# Patient Record
Sex: Female | Born: 1945 | Hispanic: No | Smoking: Never smoker
Health system: Southern US, Community
[De-identification: ages and names within clinical notes are randomized; demographics above are authoritative.]

## PROBLEM LIST (undated history)

## (undated) DIAGNOSIS — H269 Unspecified cataract: Secondary | ICD-10-CM

## (undated) DIAGNOSIS — I10 Essential (primary) hypertension: Secondary | ICD-10-CM

## (undated) DIAGNOSIS — A150 Tuberculosis of lung: Secondary | ICD-10-CM

## (undated) DIAGNOSIS — E119 Type 2 diabetes mellitus without complications: Secondary | ICD-10-CM

## (undated) DIAGNOSIS — E0590 Thyrotoxicosis, unspecified without thyrotoxic crisis or storm: Secondary | ICD-10-CM

## (undated) DIAGNOSIS — K219 Gastro-esophageal reflux disease without esophagitis: Secondary | ICD-10-CM

## (undated) HISTORY — DX: Unspecified cataract: H26.9

## (undated) HISTORY — DX: Tuberculosis of lung: A15.0

## (undated) HISTORY — DX: Gastro-esophageal reflux disease without esophagitis: K21.9

## (undated) HISTORY — PX: TOTAL ABDOMINAL HYSTERECTOMY: SHX209

## (undated) HISTORY — PX: EYE SURGERY: SHX253

## (undated) HISTORY — DX: Essential (primary) hypertension: I10

## (undated) HISTORY — DX: Thyrotoxicosis, unspecified without thyrotoxic crisis or storm: E05.90

## (undated) HISTORY — DX: Type 2 diabetes mellitus without complications: E11.9

---

## 1977-04-24 HISTORY — PX: THYROIDECTOMY: SHX17

## 1978-04-24 DIAGNOSIS — A150 Tuberculosis of lung: Secondary | ICD-10-CM

## 1978-04-24 HISTORY — DX: Tuberculosis of lung: A15.0

## 2009-01-18 ENCOUNTER — Encounter: Admission: RE | Admit: 2009-01-18 | Discharge: 2009-01-18 | Payer: Self-pay | Admitting: Specialist

## 2009-01-18 IMAGING — CR DG CHEST 1V
1 series · 1 of 1 positions shown · non-contrast
Comparison: None

CLINICAL DATA: Positive PPD.

CHEST - 1 VIEW

[view not recorded]
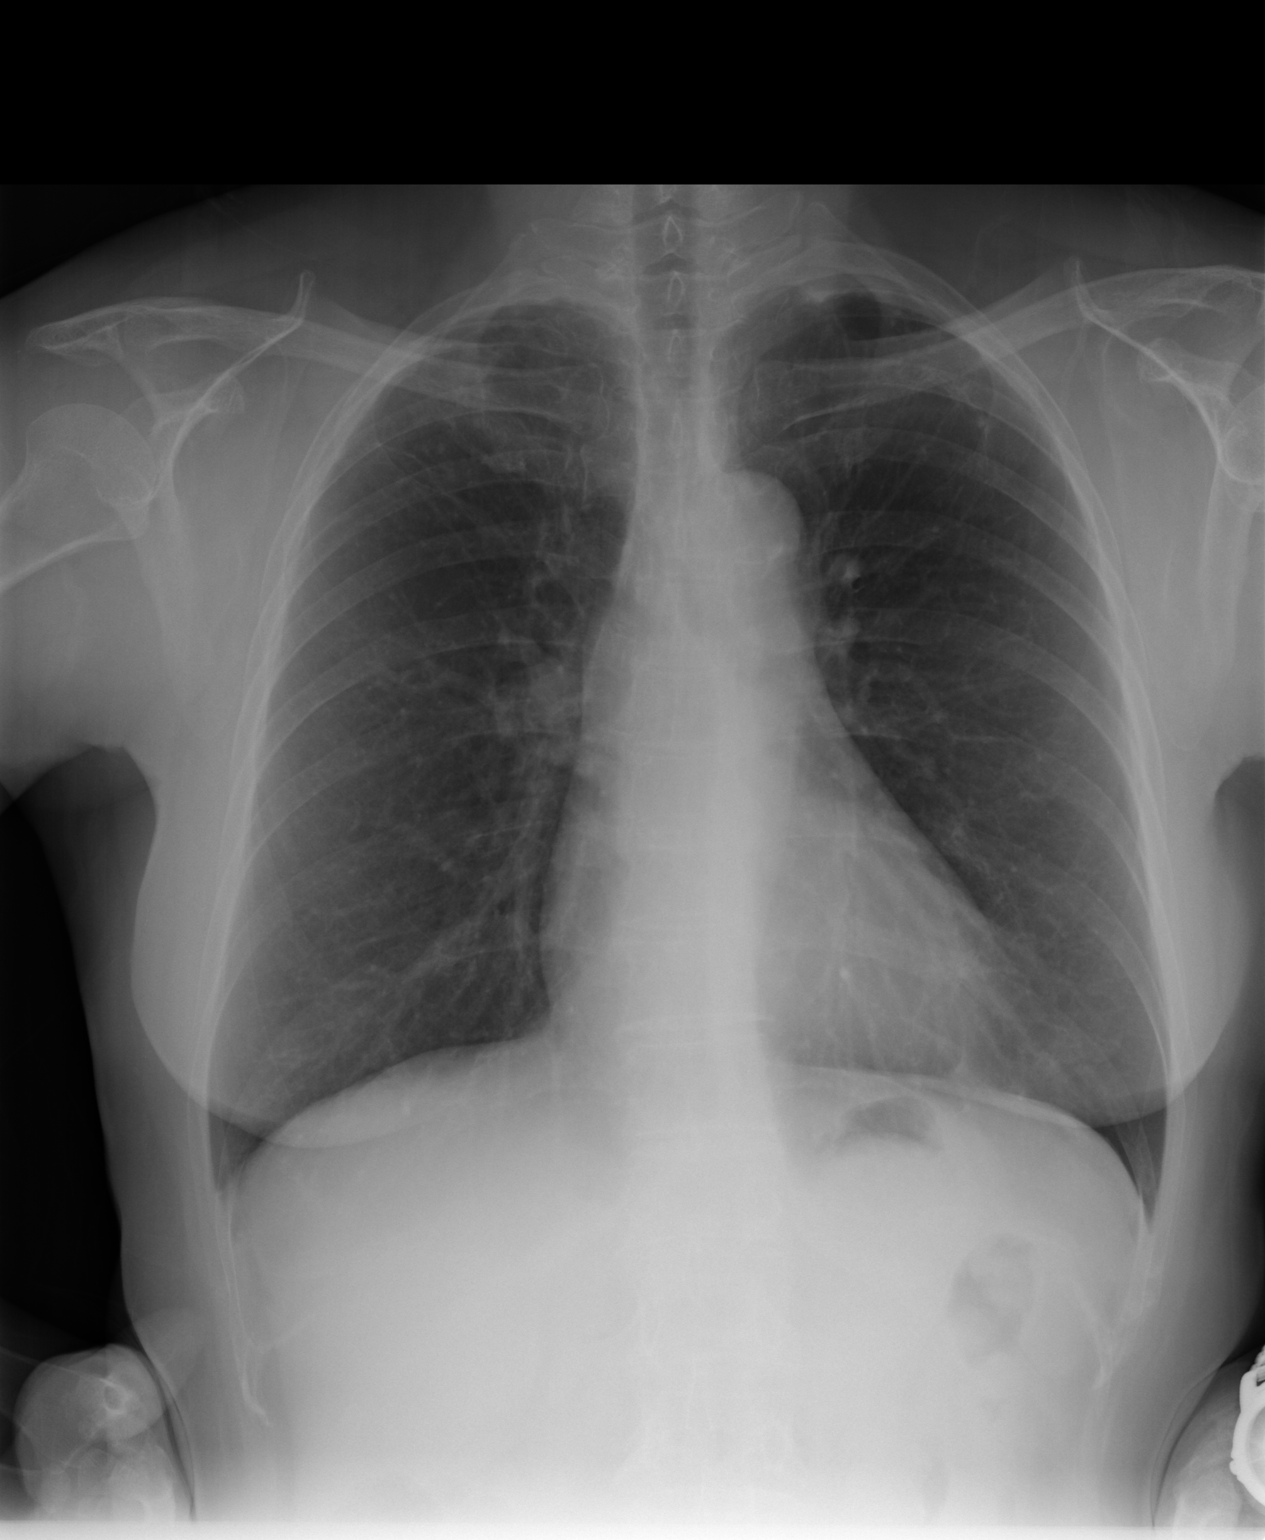

[1 of 1 positions shown; findings below may reference images not displayed]

FINDINGS: There are densities noted in the apices bilaterally,
right greater than left.  Cannot exclude reactivation TB.
Remainder lung fields are clear.  No effusions.  No acute bony
abnormality.
IMPRESSION: Predominately linear opacities in the apices bilaterally, right
greater than left.  Cannot exclude reactivation TB.

## 2011-08-22 ENCOUNTER — Ambulatory Visit (INDEPENDENT_AMBULATORY_CARE_PROVIDER_SITE_OTHER): Payer: Self-pay | Admitting: Sports Medicine

## 2011-08-22 ENCOUNTER — Encounter: Payer: Self-pay | Admitting: Sports Medicine

## 2011-08-22 VITALS — BP 162/94 | HR 82 | Temp 97.5°F | Ht 60.5 in | Wt 136.4 lb

## 2011-08-22 DIAGNOSIS — Z79899 Other long term (current) drug therapy: Secondary | ICD-10-CM | POA: Insufficient documentation

## 2011-08-22 DIAGNOSIS — Z0000 Encounter for general adult medical examination without abnormal findings: Secondary | ICD-10-CM

## 2011-08-22 DIAGNOSIS — Z8639 Personal history of other endocrine, nutritional and metabolic disease: Secondary | ICD-10-CM

## 2011-08-22 DIAGNOSIS — R030 Elevated blood-pressure reading, without diagnosis of hypertension: Secondary | ICD-10-CM

## 2011-08-22 DIAGNOSIS — I10 Essential (primary) hypertension: Secondary | ICD-10-CM | POA: Insufficient documentation

## 2011-08-22 NOTE — Assessment & Plan Note (Signed)
Will check TSH, BMET and fasting lipid panel.   Pt with

## 2011-08-22 NOTE — Patient Instructions (Signed)
It was nice to see you today.      Today we discussed your health maintenance and your elevated BP. You are due for blood work as well as a screening tests for colon cancer.   Your BP was elevated today to 162/94.  Our goal would be to get your BP to 120/80.  We will need to recheck your BP at your next visit to confirm this diagnosis but I anticipate that we will need to start medicines at your next visit.  You are due to screening for colon cancer.  I would encourage you to complete the Hemocult cards that we provided you today and bring these back with you when you come for your blood work (or you can mail them in).     You are due for Blood Work to check your thyroid, sugar and cholesterol.  After you receive [facility] Card) please call our office to come in for a LAB/Nurses visit to have your blood drawn and to have a BP reading.   Please plan to return to see me in ~1 week after your have your blood work drawn so we may discuss your BP and the results of your blood work.  If you need anything prior to seeing me please call the clinic.

## 2011-08-22 NOTE — Progress Notes (Signed)
HPI:  Kimberly House is a 66 y.o. female presenting today to establish care.  Reports otherwise healthy.  Does note that she has seen elevated BP on friends BP CUFF but no formal documentation.  Was told ~3-4 yrs ago blood glucose levels boarderline elevated but no intervention.  Has not been seeing Dr. Since in the [place].  See prior history section.  States overall no problems.   ROS Review of Systems  Constitutional: Negative for fever, chills and malaise/fatigue.  HENT: Negative for congestion.   Cardiovascular: Negative for chest pain and palpitations.       No exertional chest pain; reports 1 episode of chest soreness following shoveling snow but no pain experienced during shoveling  Gastrointestinal: Negative for blood in stool and melena.  Genitourinary: Negative for flank pain.  Musculoskeletal: Negative for back pain and falls.  Neurological: Negative for focal weakness, loss of consciousness and weakness.  Endo/Heme/Allergies: Negative for polydipsia.     HISTORY Medications Reviewed & Updated,: No meds Medical Hx Reviewed: Significant for prior hysterectomy, thyroidectomy and pulmonary TB Family History Reviewed: Yes  and fairly unremarkable Social History Reviewed:  Please see section PE: GENERAL:  Adult filipina female.  Examined in [facility].   In no discomfort; norespiratory distress.   PSYCH: Alert and appropriately interactive; Insight:Good  Alert, oriented, thought content appropriate H&N:  NECK: supple, no adenopathy, thyroid normal in size, no nodules or tenderness and trachea midline large  Low cervical transverse scar EYES: conjunctivae/corneas clear. PERRL, EOM's intact. Fundi benign. ENT+Mouth: normal TM's and external ear canals both earsnormalnormal findings: lips normal without lesions and buccal mucosa normal and abnormal findings: dentition: multiple carries THORAX: HEART: RRR, S1/S2 heard, no murmur LUNGS: CTA B, no wheezes, no crackles ABDOMEN:  +BS,  soft, non-tender, no rigidity, no guarding, no masses/organomegaly EXTREMITIES: Moves all 4 extremities spontaneously, warm well perfused, no edema, bilateral DP and PT pulses 2/4.   >NEURO: CN II-XII intact; no focal deficits in UE myotomes.

## 2011-08-22 NOTE — Assessment & Plan Note (Signed)
Will check TSH today but no s/sx of thyroid disease

## 2011-09-05 LAB — POC HEMOCCULT BLD/STL (HOME/3-CARD/SCREEN): Card #3 Fecal Occult Blood, POC: NEGATIVE

## 2011-09-05 NOTE — Progress Notes (Signed)
Addended by: [place], Giordano Getman on: 09/05/2011 05:48 PM   Modules accepted: Orders

## 2011-09-11 ENCOUNTER — Other Ambulatory Visit: Payer: Self-pay | Admitting: Sports Medicine

## 2011-10-05 ENCOUNTER — Other Ambulatory Visit: Payer: Self-pay

## 2011-10-05 DIAGNOSIS — Z0000 Encounter for general adult medical examination without abnormal findings: Secondary | ICD-10-CM

## 2011-10-05 LAB — BASIC METABOLIC PANEL
CO2: 26 mEq/L (ref 19–32)
Chloride: 106 mEq/L (ref 96–112)

## 2011-10-05 LAB — LIPID PANEL
Cholesterol: 208 mg/dL — ABNORMAL HIGH (ref 0–200)
HDL: 59 mg/dL (ref >39)
Total CHOL/HDL Ratio: 3.5 Ratio
Triglycerides: 68 mg/dL (ref <150)
VLDL: 14 mg/dL (ref 0–40)

## 2011-10-05 NOTE — Progress Notes (Signed)
BMP,FLP AND TSH DONE TODAY Kimberly House 

## 2011-11-14 ENCOUNTER — Telehealth: Payer: Self-pay | Admitting: *Deleted

## 2011-11-14 NOTE — Telephone Encounter (Signed)
Message copied by Deno Etienne on Tue Nov 14, 2011  8:56 AM ------      Message from: Gaspar Bidding D      Created: Mon Nov 13, 2011 10:24 PM       Please call pt and have her make an appointment to follow up her labs.  She has been lost to follow up

## 2011-11-14 NOTE — Telephone Encounter (Signed)
Called pt and she will come in to discuss results on 8.5.2013 @ 930 am with Dr. Berline Chough.Loralee Pacas [place]

## 2011-11-14 NOTE — Telephone Encounter (Signed)
Called and lvm for pt to call to schedule a follow up appt.Loralee Pacas [place]

## 2011-11-27 ENCOUNTER — Ambulatory Visit: Payer: Self-pay | Admitting: Sports Medicine

## 2011-11-30 ENCOUNTER — Encounter: Payer: Self-pay | Admitting: Sports Medicine

## 2011-11-30 ENCOUNTER — Ambulatory Visit (INDEPENDENT_AMBULATORY_CARE_PROVIDER_SITE_OTHER): Payer: Self-pay | Admitting: Sports Medicine

## 2011-11-30 VITALS — BP 158/88 | HR 90 | Temp 98.5°F | Ht 60.5 in | Wt 136.0 lb

## 2011-11-30 DIAGNOSIS — Z8639 Personal history of other endocrine, nutritional and metabolic disease: Secondary | ICD-10-CM

## 2011-11-30 DIAGNOSIS — R030 Elevated blood-pressure reading, without diagnosis of hypertension: Secondary | ICD-10-CM

## 2011-11-30 DIAGNOSIS — Z0000 Encounter for general adult medical examination without abnormal findings: Secondary | ICD-10-CM

## 2011-11-30 DIAGNOSIS — IMO0001 Reserved for inherently not codable concepts without codable children: Secondary | ICD-10-CM

## 2011-11-30 DIAGNOSIS — Z862 Personal history of diseases of the blood and blood-forming organs and certain disorders involving the immune mechanism: Secondary | ICD-10-CM

## 2011-11-30 DIAGNOSIS — E119 Type 2 diabetes mellitus without complications: Secondary | ICD-10-CM

## 2011-11-30 MED ORDER — METFORMIN HCL 500 MG PO TABS: 500.0000 mg | ORAL_TABLET | Freq: Two times a day (BID) | ORAL | Status: DC

## 2011-11-30 MED ORDER — LISINOPRIL-HYDROCHLOROTHIAZIDE 20-12.5 MG PO TABS: 1.0000 | ORAL_TABLET | Freq: Every day | ORAL | Status: DC

## 2011-11-30 NOTE — Patient Instructions (Addendum)
It was nice to see you today.  Today we discussed:   Your blood pressure.  I started you on a medication that I want you to take once a day.  Your diabetes.  I started you on a medication to take twice a day  The best treatment is to try to make lifestyle changes. Here are some basic nutrition rules to remember:  "Eat Real Foods & Drink Real Drinks" - if you think it was made in a factory . . it is likely best to avoid it as a staple in your diet.  Limiting these types of foods to 1-2 times per week is a good idea.  Sticking with fresh fruits and vegetables as well as home cooked meals will typically provide more nutrition and less salt than prepackaged meals.     Limit the amount of sugar sweetened and artificially sweetened foods and beverages.  Sticking with water flavored with a slice of lemon, lime or orange is a great option if you want something with flavor in it.  Using flavored seltzer water to flavor plain water will also add some bite if you want something more than flavor.     Here are 2 of my favorite web sites that provide great nutrition and exercise advice.   [email or web address] [email or web address].be  Please plan to return to see me in 3 months - .  If you need anything prior to seeing me please call the clinic.  Please call in October to make an appointment in November  Please return in 1 week for blood work.   Here are your lab results from last visit.  Your sugars are high and your cholesterol is high.  Your thyroid is borderline low.     Basename 10/05/11 0838  NA 141  K 4.4  CL 106  CO2 26  BUN 17  CREATININE 0.69  CALCIUM 9.4  GLUCOSE 134*     Basename 10/05/11 0838  CHOL 208*  TRIG 68  HDL 59    Basename 10/05/11 0838  TSH 4.391  T3FREE --  FREET4 --  T3TOTAL --  T4TOTAL --  THYROIDAB --

## 2011-11-30 NOTE — Assessment & Plan Note (Addendum)
Will start lifestyle modifications.   Given Nutrition handout in AVS Rx for Metformin 500 bid A1c at lab visit for baseline

## 2011-11-30 NOTE — Assessment & Plan Note (Addendum)
Elevated again today.  Will start dual therapy with ACEi/HCTZ given SBP > 160 on presentation. Lifestyle changes per AVS Will recheck Cr in 1 week after starting therapy.

## 2011-11-30 NOTE — Assessment & Plan Note (Signed)
TSH borderline elevated.  Will obtain T3 and T4 on follow up lab draw in 1 week. Pt does not wish for starting Synthroid unless absolutely required

## 2011-12-05 NOTE — Progress Notes (Signed)
  [facility]  Patient name: Kimberly House MRN [id number]  Date of birth: 06/16/1945  CC & HPI:  Kimberly House is a 66 y.o. female presenting today for follow-up of abnormal lab values.  Problems:   Hypertension - chronic problem, marginally controlled.   Pt reports no chest pain, no dyspnea on exertion, no orthopnea, no peripheral edema, no TIAs,   Diabetes - chronic problem, marginally controlled.  Patient reports no polyuria or polydipsia, no chest pain, dyspnea or TIA's, no numbness, tingling or pain in extremities  Hyperlipidemia - chronic problem, marginally controlled  Basename 10/05/11 0838  CHOL 208*  TRIG 68  HDL 59    Medication Compliance: has not been on any  Diet Compliance: noncompliant much of the time New Concerns: none  Thyroid ROS: denies fatigue, weight changes, heat/cold intolerance, bowel/skin changes or CVS symptoms.  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  Examined in [facility].  In no discomfort; no respiratory distress. PSYCH: Alert and appropriately interactive; Insight:Good   H&N: AT/, MMM, no scleral icterus, EOMi THORAX: HEART: RRR, S1/S2 heard, no murmur LUNGS: CTA B, no wheezes, no crackles EXTREMITIES: Moves all 4 extremities spontaneously, warm well perfused, no edema, bilateral DP and PT pulses 2/4.     Assessment & Plan:

## 2011-12-07 ENCOUNTER — Other Ambulatory Visit: Payer: Self-pay

## 2011-12-08 ENCOUNTER — Other Ambulatory Visit (INDEPENDENT_AMBULATORY_CARE_PROVIDER_SITE_OTHER): Payer: Self-pay

## 2011-12-08 DIAGNOSIS — IMO0001 Reserved for inherently not codable concepts without codable children: Secondary | ICD-10-CM

## 2011-12-08 DIAGNOSIS — E119 Type 2 diabetes mellitus without complications: Secondary | ICD-10-CM

## 2011-12-08 DIAGNOSIS — Z8639 Personal history of other endocrine, nutritional and metabolic disease: Secondary | ICD-10-CM

## 2011-12-08 NOTE — Progress Notes (Signed)
BMP,F T4,F T3,AND A1C DONE TODAY Kimberly House

## 2011-12-09 LAB — BASIC METABOLIC PANEL
CO2: 27 mEq/L (ref 19–32)
Chloride: 101 mEq/L (ref 96–112)
Glucose, Bld: 168 mg/dL — ABNORMAL HIGH (ref 70–99)
Potassium: 4.4 mEq/L (ref 3.5–5.3)
Sodium: 139 mEq/L (ref 135–145)

## 2011-12-09 LAB — T4, FREE: Free T4: 1.1 ng/dL (ref 0.80–1.80)

## 2013-01-09 ENCOUNTER — Ambulatory Visit (INDEPENDENT_AMBULATORY_CARE_PROVIDER_SITE_OTHER): Payer: Self-pay | Admitting: Sports Medicine

## 2013-01-09 VITALS — BP 129/76 | HR 93 | Temp 99.0°F | Ht 65.0 in | Wt 132.0 lb

## 2013-01-09 DIAGNOSIS — Z8639 Personal history of other endocrine, nutritional and metabolic disease: Secondary | ICD-10-CM

## 2013-01-09 DIAGNOSIS — IMO0001 Reserved for inherently not codable concepts without codable children: Secondary | ICD-10-CM

## 2013-01-09 DIAGNOSIS — E785 Hyperlipidemia, unspecified: Secondary | ICD-10-CM

## 2013-01-09 DIAGNOSIS — Z862 Personal history of diseases of the blood and blood-forming organs and certain disorders involving the immune mechanism: Secondary | ICD-10-CM

## 2013-01-09 DIAGNOSIS — Z0000 Encounter for general adult medical examination without abnormal findings: Secondary | ICD-10-CM

## 2013-01-09 DIAGNOSIS — R030 Elevated blood-pressure reading, without diagnosis of hypertension: Secondary | ICD-10-CM

## 2013-01-09 DIAGNOSIS — E119 Type 2 diabetes mellitus without complications: Secondary | ICD-10-CM

## 2013-01-09 LAB — POCT GLYCOSYLATED HEMOGLOBIN (HGB A1C): Hemoglobin A1C: 6.7

## 2013-01-09 LAB — COMPREHENSIVE METABOLIC PANEL WITH GFR
ALT: 31 U/L (ref 0–35)
AST: 22 U/L (ref 0–37)
Albumin: 4.4 g/dL (ref 3.5–5.2)
Alkaline Phosphatase: 45 U/L (ref 39–117)
BUN: 16 mg/dL (ref 6–23)
CO2: 25 meq/L (ref 19–32)
Calcium: 10 mg/dL (ref 8.4–10.5)
Chloride: 103 meq/L (ref 96–112)
Creat: 0.82 mg/dL (ref 0.50–1.10)
Glucose, Bld: 122 mg/dL — ABNORMAL HIGH (ref 70–99)
Potassium: 4.4 meq/L (ref 3.5–5.3)
Sodium: 139 meq/L (ref 135–145)
Total Bilirubin: 0.4 mg/dL (ref 0.3–1.2)
Total Protein: 7.9 g/dL (ref 6.0–8.3)

## 2013-01-09 LAB — LIPID PANEL
Cholesterol: 206 mg/dL — ABNORMAL HIGH (ref 0–200)
HDL: 56 mg/dL
LDL Cholesterol: 126 mg/dL — ABNORMAL HIGH (ref 0–99)
Total CHOL/HDL Ratio: 3.7 ratio
Triglycerides: 122 mg/dL
VLDL: 24 mg/dL (ref 0–40)

## 2013-01-09 NOTE — Assessment & Plan Note (Signed)
Improved today.  Continue current medications.  Refills provided

## 2013-01-09 NOTE — Patient Instructions (Signed)
It was nice to see you today, thanks for coming in!  Health maintenance examination We are checking labs today  Elevated BP Keep taking your medications for now and exercising daily  Diabetes mellitus I will call you if we are going to start a new medicine to protect your blood vessels   Please plan to return to see me in 3 months.    If you need anything prior to your next visit please call the clinic. Please Bring all medications or accurate medication list with you to each appointment; an accurate medication list is essential in providing you the best care possible.

## 2013-01-09 NOTE — Assessment & Plan Note (Signed)
Stable, Recheck TSH today

## 2013-01-09 NOTE — Assessment & Plan Note (Addendum)
No insurance and delcines colonoscopy, Given information for mammogram Declines immunizations due to cost - [facility] shot [facility]  Recheck lipid profile.  Recalculate ASCVD score using controlled BP given held meds today for fasting labs and home values in 100s consistently.  If elevated start statin

## 2013-01-09 NOTE — Assessment & Plan Note (Signed)
Check A1c today. Call if meds changed

## 2013-01-09 NOTE — Progress Notes (Signed)
  [facility]  Alizeh Madril Riggston - 67 y.o. female MRN [id number]  Date of birth: 23-Aug-1945  HPI & ROS  Kimberly House presents today for yearly exam.  She was lost to followup over the year and had previously been started on metformin and Zestoretic.    #  Hypertension - chronic problem, well controlled.    Home BP checks/ranges: 100s - 120s systolic   no orthostasis,   no peripheral edema  no chest pain, no dyspnea on exertion, no orthopnea/PND  no episodes of unilateral weakness, dysarthria or acute visual changes  #  Diabetes - chronic problem,moderately controlled.    no hypoglycemic symptoms/episodes.   no poluria, no polydipsia,  no new visual problems,   LE dysesthesias: None  self foot checks performed: sparsely  #  Hx of hypothyroid - chronic problem, very well controlled.    No palpitations, no thinning of hair, no sleep disturbances  TLC Compliance Diet: compliant all of the time  Exercise: compliant all of the time - 30 mins daily walking  MEDS: compliant all of the time    Care Coordination & Pertinent History  No known hx of cardiac disease Overdue for Tetanus, Zostavax, Influenza, Pneumococcal Due for Colonoscopy, Mammogram - hx of normal paps and hx of hysterectomy Non smoker  Otherwise please see associated EMR sections for complete problem List, past medical history, past surgical history, family history and social history. Medications   Current Outpatient Prescriptions on File Prior to Visit  Medication Sig Dispense Refill  . lisinopril-hydrochlorothiazide (ZESTORETIC) 20-12.5 MG per tablet Take 1 tablet by mouth daily.  30 tablet  11  . metFORMIN (GLUCOPHAGE) 500 MG tablet Take 1 tablet (500 mg total) by mouth 2 (two) times daily with a meal.  60 tablet  11   No current facility-administered medications on file prior to visit.    Objective Findings:  Vitals: BP 129/76  Pulse 93  Temp(Src) 99 F (37.2 C) (Oral)  Ht 5\' 5"   (1.651 m)  Wt 132 lb (59.875 kg)  BMI 21.97 kg/m2 PE: GENERAL: Adult female. In no discomfort; no respiratory distress  PSYCH:  alert and appropriate, good insight   HNEENT:    CARDIO:  RRR, S1/S2 heard, no murmur  LUNGS:  CTA B, no wheezes, no crackles   ABDOMEN:    EXTREM: Warm, well perfused.  Moves all 4 extremities spontaneously; no lateralization.  No noted foot lesions.  Distal pulses intact.  no pretibial edema.  GU:   SKIN: No skin lesions  NEUROMSK:    Assessment & Plan:  See problem associated charting

## 2013-01-13 ENCOUNTER — Telehealth: Payer: Self-pay | Admitting: Sports Medicine

## 2013-01-13 DIAGNOSIS — E119 Type 2 diabetes mellitus without complications: Secondary | ICD-10-CM

## 2013-01-13 NOTE — Telephone Encounter (Signed)
Pt is requesting refills of her lisinopril and metformin JW

## 2013-01-14 MED ORDER — LISINOPRIL-HYDROCHLOROTHIAZIDE 20-12.5 MG PO TABS: 1.0000 | ORAL_TABLET | Freq: Every day | ORAL | Status: DC

## 2013-01-14 MED ORDER — METFORMIN HCL 500 MG PO TABS: 500.0000 mg | ORAL_TABLET | Freq: Two times a day (BID) | ORAL | Status: DC

## 2013-01-16 ENCOUNTER — Telehealth: Payer: Self-pay | Admitting: *Deleted

## 2013-01-16 DIAGNOSIS — E785 Hyperlipidemia, unspecified: Secondary | ICD-10-CM | POA: Insufficient documentation

## 2013-01-16 MED ORDER — ATORVASTATIN CALCIUM 10 MG PO TABS: 10.0000 mg | ORAL_TABLET | Freq: Every day | ORAL | Status: DC

## 2013-01-16 NOTE — Addendum Note (Signed)
Addended by: Gaspar Bidding D on: 01/16/2013 02:13 PM   Modules accepted: Orders

## 2013-01-16 NOTE — Telephone Encounter (Signed)
Message copied by Tanna Savoy on Thu Jan 16, 2013  5:05 PM ------      Message from: Gaspar Bidding D      Created: Thu Jan 16, 2013  2:14 PM       Started lipitor - please call pt and inform of new medication. ------

## 2013-01-16 NOTE — Progress Notes (Addendum)
10 year ASCVD Risk of 17.4% - 5% with optimal risk factors. START LIPITOR 10mg  q day  >titrate to 20mg  at f/u appointment

## 2013-01-16 NOTE — Telephone Encounter (Signed)
Related message patient voiced understanding. Kimberly House S   

## 2013-01-22 ENCOUNTER — Telehealth: Payer: Self-pay | Admitting: Sports Medicine

## 2013-01-22 NOTE — Telephone Encounter (Signed)
Please advise. Kimberly House  

## 2013-01-22 NOTE — Telephone Encounter (Signed)
Patient was recently prescribed Lipitor for cholesterol. Patient states that she cannot afford to pay for med and would like something cheaper. Please call patient when new med has been sent to pharmacy.

## 2014-01-16 ENCOUNTER — Encounter: Payer: Self-pay | Admitting: Family Medicine

## 2014-01-16 ENCOUNTER — Ambulatory Visit (INDEPENDENT_AMBULATORY_CARE_PROVIDER_SITE_OTHER): Payer: Self-pay | Admitting: Family Medicine

## 2014-01-16 ENCOUNTER — Ambulatory Visit: Payer: Self-pay | Admitting: Family Medicine

## 2014-01-16 VITALS — BP 109/62 | HR 86 | Temp 97.8°F | Wt 128.0 lb

## 2014-01-16 DIAGNOSIS — Z8639 Personal history of other endocrine, nutritional and metabolic disease: Secondary | ICD-10-CM

## 2014-01-16 DIAGNOSIS — R030 Elevated blood-pressure reading, without diagnosis of hypertension: Secondary | ICD-10-CM

## 2014-01-16 DIAGNOSIS — IMO0001 Reserved for inherently not codable concepts without codable children: Secondary | ICD-10-CM

## 2014-01-16 DIAGNOSIS — Z862 Personal history of diseases of the blood and blood-forming organs and certain disorders involving the immune mechanism: Secondary | ICD-10-CM

## 2014-01-16 DIAGNOSIS — Z0000 Encounter for general adult medical examination without abnormal findings: Secondary | ICD-10-CM

## 2014-01-16 DIAGNOSIS — E785 Hyperlipidemia, unspecified: Secondary | ICD-10-CM

## 2014-01-16 DIAGNOSIS — E119 Type 2 diabetes mellitus without complications: Secondary | ICD-10-CM

## 2014-01-16 LAB — LIPID PANEL
Cholesterol: 185 mg/dL (ref 0–200)
HDL: 54 mg/dL (ref >39)
LDL Cholesterol: 103 mg/dL — ABNORMAL HIGH (ref 0–99)
Total CHOL/HDL Ratio: 3.4 Ratio
Triglycerides: 138 mg/dL (ref <150)
VLDL: 28 mg/dL (ref 0–40)

## 2014-01-16 LAB — BASIC METABOLIC PANEL
BUN: 18 mg/dL (ref 6–23)
CALCIUM: 10 mg/dL (ref 8.4–10.5)
CO2: 25 mEq/L (ref 19–32)
CREATININE: 0.99 mg/dL (ref 0.50–1.10)
Chloride: 103 mEq/L (ref 96–112)
Glucose, Bld: 145 mg/dL — ABNORMAL HIGH (ref 70–99)
Potassium: 4.2 mEq/L (ref 3.5–5.3)
SODIUM: 138 meq/L (ref 135–145)

## 2014-01-16 LAB — POCT GLYCOSYLATED HEMOGLOBIN (HGB A1C): Hemoglobin A1C: 6.8

## 2014-01-16 LAB — TSH: TSH: 3.642 u[IU]/mL (ref 0.350–4.500)

## 2014-01-16 MED ORDER — METFORMIN HCL 500 MG PO TABS: 500.0000 mg | ORAL_TABLET | Freq: Two times a day (BID) | ORAL | Status: DC

## 2014-01-16 MED ORDER — ASPIRIN EC 81 MG PO TBEC: 81.0000 mg | DELAYED_RELEASE_TABLET | Freq: Every day | ORAL | Status: DC

## 2014-01-16 MED ORDER — LISINOPRIL-HYDROCHLOROTHIAZIDE 20-12.5 MG PO TABS: 1.0000 | ORAL_TABLET | Freq: Every day | ORAL | Status: DC

## 2014-01-16 MED ORDER — ATORVASTATIN CALCIUM 40 MG PO TABS: 40.0000 mg | ORAL_TABLET | Freq: Every day | ORAL | Status: DC

## 2014-01-16 NOTE — Progress Notes (Signed)
Patient ID: Kimberly House, female   DOB: August 11, 1945, 68 y.o.   MRN: [id number]   Subjective:  Kimberly House is a 68 y.o. female here for annual exam.  [name] is a [place] immigrant with a PMH diabetes and history of high BP. She has also had a thyroidectomy for hyperthyroidism but takes no medications for this. She has had no problems getting or taking her medications. She had an adbdominal hysterectomy for IUD complications and has never had abnormal pap smears before that time.   Diabetes: Sugars have been steady at/around /dL in AM. No hypoglycemic numbers or symptoms. Eats 2 pieces of toast with breakfast otherwise very low carbohydrate diet. Walks occasionally but no strict exercise regimen.   History of high BP taking BP medication everyday. No HA/vision changes, hearing changes, focal weakness or speech changes. No CP/SOB.   Due for mammo Due for colon CA screening.  Non-smoker Has no insurance.   All other pertinent systems reviewed and are negative. Objective:  BP 109/62  Pulse 86  Temp(Src) 97.8 F (36.6 C) (Oral)  Wt 128 lb (58.06 kg)  Gen:  68 y.o. female in NAD HEENT: MMM, EOMI, PERRL, anicteric sclerae CV: RRR, no MRG, no JVD Resp: Non-labored, CTAB, no wheezes noted Abd: Soft, NTND, BS present, no guarding or organomegaly MSK: No edema noted, full ROM Neuro: Alert and oriented, speech normal Assessment & Plan:  Iretta Mangrum is a 68 y.o. female with:  Problem List Items Addressed This Visit     Endocrine   Diabetes mellitus - Primary     Type II. Discussed dietary changes at length. She will cut down on toast and rice. Tolerating metformin. Check Hb A1c today. On ACE, starting aspirin and statin today.     Relevant Medications      atorvastatin (LIPITOR) tablet      aspirin EC tablet      metFORMIN (GLUCOPHAGE) tablet      lisinopril-hydrochlorothiazide (ZESTORETIC) 20-12.5 MG per tablet   Other Relevant Orders      HgB  A1c (Completed)      Lipid Panel      Basic Metabolic Panel     Other   Health maintenance examination   Relevant Orders      HgB A1c (Completed)      TSH      Lipid Panel      Basic Metabolic Panel      POC Hemoccult Bld/Stl (3-Cd Home Screen)   History of hyperthyroidism   Relevant Orders      TSH      Lipid Panel   Elevated BP     At goal today. Could consider taking off HCTZ, but discussion with pt indicating she is fine with taking this even if she could still be at goal without it.     Relevant Medications      aspirin EC tablet      lisinopril-hydrochlorothiazide (ZESTORETIC) 20-12.5 MG per tablet   Other Relevant Orders      Basic Metabolic Panel   Hyperlipidemia with target LDL less than 100   Relevant Medications      atorvastatin (LIPITOR) tablet      aspirin EC tablet      lisinopril-hydrochlorothiazide (ZESTORETIC) 20-12.5 MG per tablet   Other Relevant Orders      Lipid Panel

## 2014-01-16 NOTE — Assessment & Plan Note (Signed)
Type II. Discussed dietary changes at length. She will cut down on toast and rice. Tolerating metformin. Check Hb A1c today. On ACE, starting aspirin and statin today.

## 2014-01-16 NOTE — Patient Instructions (Signed)
Thank you for coming in today!  We are checking some labs today, and I will call you if they are abnormal. If you do not hear from me by phone or letter in 2 weeks, please call us as I may have been unable to reach you.   - Start taking aspirin  once a day - Start taking lipitor  once a day. If this is too expensive, call the office and I will change the prescription.   These and your refills have been sent to your pharmacy.   - Get 3 cards for colon cancer screening and return them. - Cal to get your mammogram.   I will see you in 1 year.   Take care and seek immediate care sooner if you develop any concerns.  Please feel free to call with any questions or concerns at any time, at [phone]. - Dr. Jarvis Newcomer

## 2014-01-16 NOTE — Assessment & Plan Note (Signed)
At goal today. Could consider taking off HCTZ, but discussion with pt indicating she is fine with taking this even if she could still be at goal without it.

## 2014-01-19 ENCOUNTER — Telehealth: Payer: Self-pay | Admitting: *Deleted

## 2014-01-19 DIAGNOSIS — R030 Elevated blood-pressure reading, without diagnosis of hypertension: Secondary | ICD-10-CM

## 2014-01-19 DIAGNOSIS — IMO0001 Reserved for inherently not codable concepts without codable children: Secondary | ICD-10-CM

## 2014-01-19 DIAGNOSIS — E119 Type 2 diabetes mellitus without complications: Secondary | ICD-10-CM

## 2014-01-19 NOTE — Telephone Encounter (Signed)
Pt called stating that lisinopril-hydrochlorothiazide (ZESTORETIC) 20-12.5 MG is to expansive.  Pt would like a cheaper medication sent to [facility].  Clovis Pu, RN

## 2014-01-21 MED ORDER — LISINOPRIL 20 MG PO TABS: 20.0000 mg | ORAL_TABLET | Freq: Every day | ORAL | Status: DC

## 2015-01-27 ENCOUNTER — Ambulatory Visit (INDEPENDENT_AMBULATORY_CARE_PROVIDER_SITE_OTHER): Payer: Medicaid Other | Admitting: Family Medicine

## 2015-01-27 VITALS — BP 122/76 | HR 87 | Temp 98.2°F | Ht 65.0 in | Wt 130.4 lb

## 2015-01-27 DIAGNOSIS — E119 Type 2 diabetes mellitus without complications: Secondary | ICD-10-CM

## 2015-01-27 DIAGNOSIS — IMO0001 Reserved for inherently not codable concepts without codable children: Secondary | ICD-10-CM

## 2015-01-27 DIAGNOSIS — Z1239 Encounter for other screening for malignant neoplasm of breast: Secondary | ICD-10-CM

## 2015-01-27 DIAGNOSIS — Z23 Encounter for immunization: Secondary | ICD-10-CM | POA: Diagnosis not present

## 2015-01-27 DIAGNOSIS — Z0000 Encounter for general adult medical examination without abnormal findings: Secondary | ICD-10-CM | POA: Diagnosis not present

## 2015-01-27 DIAGNOSIS — E785 Hyperlipidemia, unspecified: Secondary | ICD-10-CM | POA: Diagnosis not present

## 2015-01-27 DIAGNOSIS — Z87898 Personal history of other specified conditions: Secondary | ICD-10-CM

## 2015-01-27 DIAGNOSIS — Z9189 Other specified personal risk factors, not elsewhere classified: Secondary | ICD-10-CM

## 2015-01-27 DIAGNOSIS — Z8639 Personal history of other endocrine, nutritional and metabolic disease: Secondary | ICD-10-CM | POA: Diagnosis not present

## 2015-01-27 DIAGNOSIS — R030 Elevated blood-pressure reading, without diagnosis of hypertension: Secondary | ICD-10-CM

## 2015-01-27 DIAGNOSIS — E559 Vitamin D deficiency, unspecified: Secondary | ICD-10-CM

## 2015-01-27 LAB — LIPID PANEL
CHOL/HDL RATIO: 3.6 ratio (ref <=5.0)
CHOLESTEROL: 204 mg/dL — AB (ref 125–200)
HDL: 56 mg/dL (ref >=46)
LDL Cholesterol: 119 mg/dL (ref <130)
Triglycerides: 147 mg/dL (ref <150)
VLDL: 29 mg/dL (ref <30)

## 2015-01-27 LAB — BASIC METABOLIC PANEL WITH GFR
BUN: 24 mg/dL (ref 7–25)
CALCIUM: 9.8 mg/dL (ref 8.6–10.4)
CO2: 26 mmol/L (ref 20–31)
Chloride: 100 mmol/L (ref 98–110)
Creat: 0.94 mg/dL (ref 0.50–0.99)
GFR, EST AFRICAN AMERICAN: 72 mL/min (ref >=60)
GFR, EST NON AFRICAN AMERICAN: 62 mL/min (ref >=60)
Glucose, Bld: 119 mg/dL — ABNORMAL HIGH (ref 65–99)
POTASSIUM: 5.5 mmol/L — AB (ref 3.5–5.3)
SODIUM: 140 mmol/L (ref 135–146)

## 2015-01-27 LAB — POCT GLYCOSYLATED HEMOGLOBIN (HGB A1C): HEMOGLOBIN A1C: 7.3

## 2015-01-27 LAB — TSH: TSH: 5.09 u[IU]/mL — AB (ref 0.350–4.500)

## 2015-01-27 MED ORDER — ATORVASTATIN CALCIUM 40 MG PO TABS: 40.0000 mg | ORAL_TABLET | Freq: Every day | ORAL | Status: DC

## 2015-01-27 MED ORDER — LISINOPRIL 20 MG PO TABS: 20.0000 mg | ORAL_TABLET | Freq: Every day | ORAL | Status: DC

## 2015-01-27 MED ORDER — METFORMIN HCL 500 MG PO TABS: 500.0000 mg | ORAL_TABLET | Freq: Two times a day (BID) | ORAL | Status: DC

## 2015-01-27 NOTE — Patient Instructions (Addendum)
Thank you for coming in today!  We are checking some labs today, and we'll call you if they are abnormal. If you do not hear from me by phone or letter in 2 weeks, please call us as we may have been unable to reach you.   - Continue taking lisinopril and metformin, these were sent to your pharmacy.  - Start also taking lipitor for cholesterol to prevent heart attacks and strokes. This is covered by [facility] and has also been sent to your pharmacy.  - Start taking a daily over-the-counter multivitamin - Bring all medications in a bag to your visits. - Go get your mammogram, they will send me the results - You will be contacted for an eye exam which you should have about every year  Our clinic's number is [phone]. Feel free to call any time with questions or concerns. We will answer any questions after hours with our 24-hour emergency line at that number as well.   - Dr. Jarvis Newcomer

## 2015-01-27 NOTE — Progress Notes (Signed)
Subjective: Kimberly House is a 69 y.o. female presenting for annual physical exam.  She has no complaints. Denies fever, chills, weight loss, changes in vision or hearing, headache, cough, sore throat, chest pain, palpitations, shortness of breath, abdominal pain, nausea, vomiting, changes in bowel habits, blood in stool, change in bladder habits, myalgias, arthralgias, and rash.   Has been avoiding saturated fats, not been taking atorvastatin because of cost. Forgot to take aspirin, but has been taking lisinopril and metformin. Rarely checks blood sugars.   - PHQ-2: neg - Never smoker  Objective: BP 122/76 mmHg  Pulse 87  Temp(Src) 98.2 F (36.8 C) (Oral)  Ht  (1.651 m)  Wt 130 lb 6.4 oz (59.149 kg)  BMI 21.70 kg/m2 Gen: Well-appearing 69 y.o. female in no distress HEENT: PERRL, MMM, posterior oropharynx clear, good dentition Pulm: Non-labored; CTAB, no wheezes  CV: Regular rate, no murmur; distal pulses intact/symmetric; no LE edema GI: normal BS; soft, non-tender, non-distended, no HSM, no hernia Skin: No rashes, wounds, ulcers MSK: Normal gait and station; no digital clubbing/cyanosis Neuro: CN II-XII without deficits, sensation intact to light touch Psych: A&Ox3, mood normal with broad affect, speech clear and coherent See diabetic foot exam  Assessment/Plan: Kimberly House is a 69 y.o. female here for annual check up.

## 2015-01-28 ENCOUNTER — Encounter: Payer: Self-pay | Admitting: Family Medicine

## 2015-01-28 ENCOUNTER — Ambulatory Visit
Admission: RE | Admit: 2015-01-28 | Discharge: 2015-01-28 | Disposition: A | Discharge status: Home / Self Care | Payer: Medicaid Other | Source: Ambulatory Visit | Attending: Family Medicine | Admitting: Family Medicine

## 2015-01-28 DIAGNOSIS — Z1239 Encounter for other screening for malignant neoplasm of breast: Secondary | ICD-10-CM

## 2015-01-28 LAB — CBC
HCT: 44 % (ref 36.0–46.0)
Hemoglobin: 15.6 g/dL — ABNORMAL HIGH (ref 12.0–15.0)
MCH: 31.4 pg (ref 26.0–34.0)
MCHC: 35.5 g/dL (ref 30.0–36.0)
MCV: 88.5 fL (ref 78.0–100.0)
MPV: 9.7 fL (ref 8.6–12.4)
Platelets: 552 10*3/uL — ABNORMAL HIGH (ref 150–400)
RBC: 4.97 MIL/uL (ref 3.87–5.11)
RDW: 14.1 % (ref 11.5–15.5)
WBC: 7.8 10*3/uL (ref 4.0–10.5)

## 2015-01-28 LAB — VITAMIN D 25 HYDROXY (VIT D DEFICIENCY, FRACTURES): Vit D, 25-Hydroxy: 21 ng/mL — ABNORMAL LOW (ref 30–100)

## 2015-01-28 NOTE — Assessment & Plan Note (Signed)
-   Bone density testing discussed, will defer for now but check Vit D. Advised daily multivitamin. - Pneumovax, flu, and TDaP to be given today - Mammogram ordered and discussed w/pt - Medications refilled

## 2015-01-28 NOTE — Assessment & Plan Note (Addendum)
Check Hb A1c, with previous readings stable and under goal will refill metformin and continue carbohydrate avoidance. Cont ASA, statin, lisinopril. Foot exam was normal today, eye exam referral placed and discussed.

## 2015-01-28 NOTE — Assessment & Plan Note (Signed)
Check lipids, restart statin (atorva is on medicaid formulary)

## 2015-01-28 NOTE — Assessment & Plan Note (Addendum)
Check TSH, though remains asymptomatic and TSH in range in the past. Had thyroid surgery for hyperthyroidism ~40 years ago.

## 2015-02-02 DIAGNOSIS — E559 Vitamin D deficiency, unspecified: Secondary | ICD-10-CM | POA: Insufficient documentation

## 2015-02-02 MED ORDER — CHOLECALCIFEROL 1.25 MG (50000 UT) PO TABS: 50000.0000 [IU] | ORAL_TABLET | ORAL | Status: DC

## 2015-02-02 NOTE — Addendum Note (Signed)
Addended by: Hazeline Junker B on: 02/02/2015 03:12 PM   Modules accepted: Orders, SmartSet

## 2015-02-03 ENCOUNTER — Telehealth: Payer: Self-pay | Admitting: *Deleted

## 2015-02-03 LAB — HM DIABETES EYE EXAM

## 2015-02-03 NOTE — Telephone Encounter (Signed)
Contacted pt and informed her of below. Zimmerman Rumple, Angelynn Lemus D, CMA  

## 2015-02-03 NOTE — Telephone Encounter (Signed)
-----   Message from Tyrone Nineyan B Grunz, MD sent at 02/02/2015  3:12 PM EDT ----- Could you please call to tell her she will need to pick up vitamin D from the drug store and take this weekly for 12 weeks because her level was low. She needs to follow up after this (~3 months or so). Thank you!

## 2015-03-02 ENCOUNTER — Encounter: Payer: Self-pay | Admitting: Family Medicine

## 2015-05-14 ENCOUNTER — Encounter: Payer: Self-pay | Admitting: Family Medicine

## 2015-05-14 ENCOUNTER — Ambulatory Visit: Payer: Medicaid Other | Admitting: Family Medicine

## 2015-05-14 ENCOUNTER — Ambulatory Visit (INDEPENDENT_AMBULATORY_CARE_PROVIDER_SITE_OTHER): Payer: Medicaid Other | Admitting: Family Medicine

## 2015-05-14 VITALS — BP 138/78 | HR 87 | Temp 98.3°F | Wt 132.7 lb

## 2015-05-14 DIAGNOSIS — E559 Vitamin D deficiency, unspecified: Secondary | ICD-10-CM | POA: Diagnosis not present

## 2015-05-14 DIAGNOSIS — E119 Type 2 diabetes mellitus without complications: Secondary | ICD-10-CM

## 2015-05-14 LAB — POCT GLYCOSYLATED HEMOGLOBIN (HGB A1C): Hemoglobin A1C: 8.3

## 2015-05-14 NOTE — Progress Notes (Signed)
Subjective:     Patient ID: Kimberly House, female   DOB: Mar 11, 1946, 70 y.o.   MRN: [id number]  HPI Kimberly House is a 70yo female presenting today for diabetes management. - Last A1C 7.3 in 01/2015 - Currently prescribed Metformin  BID - Denies peripheral neuralgia, blurred vision, changes in urination - Follows with ophthalmology - States she has been traveling for the last two months and not watching her diet or exercising. - Has been taking Vitamin D. Would like level checked today. - Denies any acute complaints  - Smoking status reviewed.  Review of Systems Per HPI. Other systems negative.    Objective:   Physical Exam  Constitutional: She appears well-developed and well-nourished. No distress.  HENT:  Head: Normocephalic and atraumatic.  Mouth/Throat: No oropharyngeal exudate.  Cardiovascular: Normal rate and regular rhythm.  Exam reveals no gallop and no friction rub.   No murmur heard. Pedal pulses palpable bilaterally  Pulmonary/Chest: Effort normal. No respiratory distress. She has no wheezes. She has no rales.  Abdominal: Soft. She exhibits no distension. There is no tenderness. There is no rebound.  Musculoskeletal: She exhibits no edema.  Neurological:  Sensation intact over feet bilaterally  Skin: No rash noted.  No ulcers or sores noted on feet bilaterally.  Psychiatric: She has a normal mood and affect. Her behavior is normal.      Assessment and Plan:     Diabetes mellitus - A1C increased to 8.3 - Currently prescribed Metformin  BID. Recommended increasing dose to BID, however patient refuses at this time. - Would like to continue to work on diet and exercise. States she has not been eating healthy or exercising over the last two months since she has been out of the country. Refuses Nutrition referral, but states plan of decreasing amount of carbs. - Recommend return in 3months to recheck A1C. Agrees to consider change in medication at  that time if A1C fails to improve or worsens.  Vitamin D deficiency - Recheck Vitamin D level

## 2015-05-14 NOTE — Patient Instructions (Signed)
Thank you so much for coming to visit me today! Please work very hard on your diet and exercise. Please return in 3months to recheck your blood sugar. If it continues to rise, we will need to change some of your medications. We will check your vitamin D level today. We will contact you with the results.  Thanks again! Dr. Caroleen Hamman

## 2015-05-14 NOTE — Assessment & Plan Note (Signed)
-   Recheck Vitamin D level

## 2015-05-14 NOTE — Assessment & Plan Note (Signed)
-   A1C increased to 8.3 - Currently prescribed Metformin  BID. Recommended increasing dose to BID, however patient refuses at this time. - Would like to continue to work on diet and exercise. States she has not been eating healthy or exercising over the last two months since she has been out of the country. Refuses Nutrition referral, but states plan of decreasing amount of carbs. - Recommend return in 3months to recheck A1C. Agrees to consider change in medication at that time if A1C fails to improve or worsens.

## 2015-05-15 LAB — VITAMIN D 25 HYDROXY (VIT D DEFICIENCY, FRACTURES): VIT D 25 HYDROXY: 64 ng/mL (ref 30–100)

## 2015-05-19 ENCOUNTER — Telehealth: Payer: Self-pay | Admitting: Family Medicine

## 2015-05-19 MED ORDER — CHOLECALCIFEROL 50 MCG (2000 UT) PO TABS: 1.0000 | ORAL_TABLET | Freq: Every day | ORAL | Status: DC

## 2015-05-19 NOTE — Telephone Encounter (Signed)
Vitamin D normal at 64, up from 21 at last visit. Will transition from Vitamin D 50,000 weekly to Vitamin D 2,000 units daily to maintain vitamin D levels. Prescription sent to pharmacy. Please stop Vitamin D 50,000 weekly.

## 2015-05-19 NOTE — Telephone Encounter (Signed)
Spoke to pt. Informed of the below information. Patient had good understanding. Will take new Vitamin D Rx daily for maintenance. Sunday Spillers, CMA

## 2015-12-08 ENCOUNTER — Other Ambulatory Visit: Payer: Self-pay | Admitting: *Deleted

## 2015-12-08 DIAGNOSIS — E119 Type 2 diabetes mellitus without complications: Secondary | ICD-10-CM

## 2015-12-09 MED ORDER — METFORMIN HCL 500 MG PO TABS: 500.0000 mg | ORAL_TABLET | Freq: Two times a day (BID) | ORAL | 0 refills | Status: DC

## 2015-12-10 ENCOUNTER — Other Ambulatory Visit: Payer: Self-pay | Admitting: *Deleted

## 2015-12-10 DIAGNOSIS — R030 Elevated blood-pressure reading, without diagnosis of hypertension: Secondary | ICD-10-CM

## 2015-12-10 DIAGNOSIS — IMO0001 Reserved for inherently not codable concepts without codable children: Secondary | ICD-10-CM

## 2015-12-10 DIAGNOSIS — E119 Type 2 diabetes mellitus without complications: Secondary | ICD-10-CM

## 2015-12-10 MED ORDER — LISINOPRIL 20 MG PO TABS: 20.0000 mg | ORAL_TABLET | Freq: Every day | ORAL | 3 refills | Status: DC

## 2015-12-10 NOTE — Telephone Encounter (Signed)
Refill request for 90 day supply.  Martin, Tamika L, RN  

## 2016-02-02 ENCOUNTER — Other Ambulatory Visit: Payer: Self-pay | Admitting: Student

## 2016-02-02 ENCOUNTER — Encounter: Payer: Self-pay | Admitting: Student

## 2016-02-02 ENCOUNTER — Ambulatory Visit (INDEPENDENT_AMBULATORY_CARE_PROVIDER_SITE_OTHER): Payer: Medicaid Other | Admitting: Student

## 2016-02-02 VITALS — BP 130/70 | HR 79 | Temp 98.3°F | Ht 64.0 in | Wt 126.4 lb

## 2016-02-02 DIAGNOSIS — K089 Disorder of teeth and supporting structures, unspecified: Secondary | ICD-10-CM

## 2016-02-02 DIAGNOSIS — Z1231 Encounter for screening mammogram for malignant neoplasm of breast: Secondary | ICD-10-CM | POA: Diagnosis not present

## 2016-02-02 DIAGNOSIS — E559 Vitamin D deficiency, unspecified: Secondary | ICD-10-CM | POA: Diagnosis not present

## 2016-02-02 DIAGNOSIS — Z1239 Encounter for other screening for malignant neoplasm of breast: Secondary | ICD-10-CM

## 2016-02-02 DIAGNOSIS — E119 Type 2 diabetes mellitus without complications: Secondary | ICD-10-CM

## 2016-02-02 DIAGNOSIS — E785 Hyperlipidemia, unspecified: Secondary | ICD-10-CM

## 2016-02-02 DIAGNOSIS — Z23 Encounter for immunization: Secondary | ICD-10-CM | POA: Diagnosis not present

## 2016-02-02 DIAGNOSIS — M8580 Other specified disorders of bone density and structure, unspecified site: Secondary | ICD-10-CM

## 2016-02-02 DIAGNOSIS — Z13820 Encounter for screening for osteoporosis: Secondary | ICD-10-CM | POA: Diagnosis not present

## 2016-02-02 DIAGNOSIS — Z1211 Encounter for screening for malignant neoplasm of colon: Secondary | ICD-10-CM | POA: Diagnosis not present

## 2016-02-02 DIAGNOSIS — Z0000 Encounter for general adult medical examination without abnormal findings: Secondary | ICD-10-CM | POA: Diagnosis present

## 2016-02-02 DIAGNOSIS — Z79899 Other long term (current) drug therapy: Secondary | ICD-10-CM | POA: Diagnosis not present

## 2016-02-02 DIAGNOSIS — E875 Hyperkalemia: Secondary | ICD-10-CM | POA: Diagnosis not present

## 2016-02-02 LAB — BASIC METABOLIC PANEL WITH GFR
BUN: 21 mg/dL (ref 7–25)
CALCIUM: 9.7 mg/dL (ref 8.6–10.4)
CO2: 24 mmol/L (ref 20–31)
CREATININE: 0.94 mg/dL — AB (ref 0.60–0.93)
Chloride: 104 mmol/L (ref 98–110)
GFR, EST AFRICAN AMERICAN: 71 mL/min (ref >=60)
GFR, EST NON AFRICAN AMERICAN: 62 mL/min (ref >=60)
Glucose, Bld: 113 mg/dL — ABNORMAL HIGH (ref 65–99)
Potassium: 4.8 mmol/L (ref 3.5–5.3)
SODIUM: 139 mmol/L (ref 135–146)

## 2016-02-02 LAB — LIPID PANEL
Cholesterol: 117 mg/dL — ABNORMAL LOW (ref 125–200)
HDL: 60 mg/dL (ref >=46)
LDL CALC: 43 mg/dL (ref <130)
TRIGLYCERIDES: 70 mg/dL (ref <150)
Total CHOL/HDL Ratio: 2 Ratio (ref <=5.0)
VLDL: 14 mg/dL (ref <30)

## 2016-02-02 LAB — POCT GLYCOSYLATED HEMOGLOBIN (HGB A1C): HEMOGLOBIN A1C: 7.1

## 2016-02-02 NOTE — Progress Notes (Signed)
   Subjective:    Patient ID: Kimberly House, female    DOB: 1945/08/20, 70 y.o.   MRN: [id number]    HPI Patient is here for annual physical. She has no concerns today.  Screening Cancer Colon Cancer: never had colonoscopy. She denies family history of colon cancer  Breast Cancer: normal screening mammogram 2 months ago  Cervical cancer: Never had a Pap smear since she came to [place]. She says she had Pap smear in [place] about 11 years ago which was normal. There is no documentation of this in the chart  Osteoporosis: needs DEXA scan.   Eye: last visit 02/03/2015  Dentist: hasn't been to a dentist in a while  Chronic dis Diabetes: A1c 7.1 today. 8.3 about 10 month ago. She is on metformin 500 mg twice a day Hyperlipidemia: Lipid panel 1 year ago. She is on atorvastatin 40 mg Osteoporosis: Never had a DEXA scan.  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 Stationary bicycle Diet: cutting down on rice. More vegetables Sexual activity: widow. Lives with daughter's family   Immunizations  Influenza: Wants to get her influenza shot today  Zoster: needs Zostavax  Pneumococcal: uptodate  ASA: yes  VitD/Ca Review of Systems Objective:   Physical Exam Vitals:   02/02/16 0853  BP: 130/70  Pulse: 79  Temp: 98.3 F (36.8 C)  TempSrc: Oral  SpO2: 99%  Weight: 126 lb 6.4 oz (57.3 kg)  Height: 5\' 4"  (1.626 m)    General: Alert and oriented. Pleasant and cooperative. Well-nourished and well-developed.  Head: Normocephalic and atraumatic. Eyes: Without icterus, sclera clear and conjunctiva pink.  Ears: Normal auditory acuity. TMs and ear canals normal Cardiovascular: S1, S2 present without murmurs. Extremities without clubbing or  edema. Respiratory: Clear to auscultation bilaterally. No wheezes, rales, or rhonchi. No distress.  Gastrointestinal: +BS, soft, non-tender and non-distended. No HSM noted. No guarding or rebound. No masses appreciated.  Genitourinary: Deferred  Musculoskalatal: Symmetrical without gross deformities. Skin: Intact without significant lesions or rashes. Neurologic: Alert and oriented x4; grossly normal. Psych: Alert and cooperative. Normal mood and affect. Heme/Lymph/Immune: No excessive bruising noted    Assessment & Plan:  Health maintenance examination GI referral for colonoscopy ordered Mammogram and DEXA scan ordered Flu vaccine today Zostavax next visit Hepatitis C screening Needs Pap smear given no previous screening for cervical cancer. Patient to return in a week or 2 for Pap smear.  Others are up-to-date  Diabetes mellitus (HCC) Well-controlled. A1c 7.1 (from 8.3 about 2 months ago). Continue metformin. On lisinopril for renal protection. Continue aspirin and statin. Due for eye and dental exam. Foot exam next visit. Vitamin B12 level due to long-term use of metformin.   Hyperlipidemia with target LDL less than 100 Lipid panel today. Continue atorvastatin 40 mg daily  Vitamin D deficiency Will try to add on lab for vitamin D level.

## 2016-02-02 NOTE — Assessment & Plan Note (Addendum)
Well-controlled. A1c 7.1 (from 8.3 about 2 months ago). Continue metformin. On lisinopril for renal protection. Continue aspirin and statin. Due for eye and dental exam. Foot exam next visit. Vitamin B12 level due to long-term use of metformin.

## 2016-02-02 NOTE — Assessment & Plan Note (Signed)
Will try to add on lab for vitamin D level.

## 2016-02-02 NOTE — Patient Instructions (Addendum)
It was great seeing you today! We have addressed the following issues today  1. Diabetes: Your A1c is 7.1 which is good. It was 8.3 about 10 months ago. Goal A1c is less than 7. I recommend you see your eye doctor and your dentist.  2. Colon Cancer: Ordered a referral to gastroenterologist for colonoscopy. Someone will get in touch with you about this 3. Screening for breast cancer: Please use the phone numbers I gave you to schedule this 4. Screening for osteoporosis: I have ordered a referral for this as well. Someone will get in touch with you about this 5.   Screening for cervical cancer: I recommend you schedule an appointment as soon as possible 6.   Keep up with exercise. I also recommend strength exercise such as lifting weight.     If we did any lab work today, and the results require attention, either me or my nurse will get in touch with you. If everything is normal, you will get a letter in mail. If you don't hear from us in two weeks, please give us a call. Otherwise, I look forward to talking with you again at our next visit. If you have any questions or concerns before then, please call the clinic at [phone].  Please bring all your medications to every doctors visit   Sign up for My Chart to have easy access to your labs results, and communication with your Primary care physician.    Please check-out at the front desk before leaving the clinic.   Take Care,

## 2016-02-02 NOTE — Assessment & Plan Note (Addendum)
GI referral for colonoscopy ordered Mammogram and DEXA scan ordered Flu vaccine today Zostavax next visit Hepatitis C screening Needs Pap smear given no previous screening for cervical cancer. Patient to return in a week or 2 for Pap smear.  Others are up-to-date

## 2016-02-02 NOTE — Assessment & Plan Note (Signed)
Lipid panel today °- Continue atorvastatin 40 mg daily °

## 2016-02-03 LAB — VITAMIN D 25 HYDROXY (VIT D DEFICIENCY, FRACTURES): VIT D 25 HYDROXY: 40 ng/mL (ref 30–100)

## 2016-02-03 LAB — VITAMIN B12: Vitamin B-12: 442 pg/mL (ref 200–1100)

## 2016-02-08 ENCOUNTER — Encounter: Payer: Self-pay | Admitting: Student

## 2016-02-08 ENCOUNTER — Ambulatory Visit (INDEPENDENT_AMBULATORY_CARE_PROVIDER_SITE_OTHER): Payer: Medicaid Other | Admitting: Student

## 2016-02-08 DIAGNOSIS — Z90710 Acquired absence of both cervix and uterus: Secondary | ICD-10-CM | POA: Diagnosis not present

## 2016-02-08 DIAGNOSIS — Z0000 Encounter for general adult medical examination without abnormal findings: Secondary | ICD-10-CM | POA: Diagnosis not present

## 2016-02-08 DIAGNOSIS — E119 Type 2 diabetes mellitus without complications: Secondary | ICD-10-CM | POA: Diagnosis not present

## 2016-02-08 MED ORDER — ZOSTER VACCINE LIVE 19400 UNT/0.65ML ~~LOC~~ SUSR: 0.6500 mL | Freq: Once | SUBCUTANEOUS | 0 refills | Status: AC

## 2016-02-08 NOTE — Patient Instructions (Signed)
It was great seeing you today! We have addressed the following issues today  1. Pap smear: You do not need a Pap smear since you had total hysterectomy 2    Shingles vaccine: Please take the prescription I gave you to your pharmacy to have your shingles vaccine 3.   Diabetes: Please come back and see us in 6 months.    If we did any lab work today, and the results require attention, either me or my nurse will get in touch with you. If everything is normal, you will get a letter in mail. If you don't hear from us in two weeks, please give us a call. Otherwise, we look forward to seeing you again at your next visit. If you have any questions or concerns before then, please call the clinic at [phone].   Please bring all your medications to every doctors visit   Sign up for My Chart to have easy access to your labs results, and communication with your Primary care physician.     Please check-out at the front desk before leaving the clinic.    Take Care,

## 2016-02-09 DIAGNOSIS — Z90710 Acquired absence of both cervix and uterus: Secondary | ICD-10-CM | POA: Insufficient documentation

## 2016-02-09 NOTE — Assessment & Plan Note (Signed)
Follow up in 6 months 

## 2016-02-09 NOTE — Assessment & Plan Note (Signed)
Gave prescription for Zostavax. 

## 2016-02-09 NOTE — Progress Notes (Signed)
   Subjective:    Patient ID: Kimberly House is a 70 y.o. old female.  HPI #Patient here for Pap smear: She has total vaginal hysterectomy about 16 years ago for uterine prolapse. Pathology at that time was benign. So there is no indication for Pap smear today.   Review of Systems Per HPI Objective:   Vitals:   02/08/16 1217  BP: (!) 148/64  Pulse: 70  Temp: 97.8 F (36.6 C)  TempSrc: Oral  SpO2: 98%  Weight: 127 lb (57.6 kg)  Height: 5\' 4"  (1.626 m)    GEN: appears well, no apparent distress. RESP: no increased work of breathing NEURO: alert and oriented appropriately, no gross defecits  PSYCH: appropriate mood and affect     Assessment & Plan:  H/O total vaginal hysterectomy In 2001 for uterine prolapse. Pathology normal. Doesn't need Pap smear.  Health maintenance examination Gave prescription for Zostavax  Diabetes mellitus (HCC) Follow-up in 6 months

## 2016-02-09 NOTE — Assessment & Plan Note (Signed)
In 2001 for uterine prolapse. Pathology normal. Doesn't need Pap smear.

## 2016-02-11 ENCOUNTER — Other Ambulatory Visit: Payer: Self-pay | Admitting: Student

## 2016-02-11 DIAGNOSIS — E119 Type 2 diabetes mellitus without complications: Secondary | ICD-10-CM

## 2016-02-11 DIAGNOSIS — E785 Hyperlipidemia, unspecified: Secondary | ICD-10-CM

## 2016-02-11 MED ORDER — ATORVASTATIN CALCIUM 40 MG PO TABS: 40.0000 mg | ORAL_TABLET | Freq: Every day | ORAL | 3 refills | Status: DC

## 2016-02-16 ENCOUNTER — Ambulatory Visit
Admission: RE | Admit: 2016-02-16 | Discharge: 2016-02-16 | Disposition: A | Discharge status: Home / Self Care | Payer: Medicaid Other | Source: Ambulatory Visit | Attending: Student | Admitting: Student

## 2016-02-16 DIAGNOSIS — M8580 Other specified disorders of bone density and structure, unspecified site: Secondary | ICD-10-CM

## 2016-02-16 DIAGNOSIS — Z1231 Encounter for screening mammogram for malignant neoplasm of breast: Secondary | ICD-10-CM

## 2016-02-17 ENCOUNTER — Telehealth: Payer: Self-pay | Admitting: Student

## 2016-02-17 NOTE — Telephone Encounter (Signed)
Called patient to discuss about her DEXA scan results which is significant for osteoporosis in her lumbar spines. Advised patient to schedule an appointment to address this. Patient voiced understanding and agreed to do as advised. She appreciated the call.

## 2016-02-17 NOTE — Telephone Encounter (Signed)
Pt said she needed to be seen on November 3rd, pt stated she wanted to come in the morning, I only have afternoon spots. Pt stated Dr. Alanda SlimGonfa said she could come that morning at 10:40am. Is this something you want me to schedule? Please advise. Thanks! ep

## 2016-02-23 NOTE — Telephone Encounter (Signed)
Called and talked to patient about the confusion with scheduling. Patient will call again to schedule an appointment. She understood that I won't be in clinic on Friday (02/25/2016) in the morning.  Thanks!

## 2016-02-28 ENCOUNTER — Encounter: Payer: Self-pay | Admitting: Student

## 2016-02-28 ENCOUNTER — Ambulatory Visit (INDEPENDENT_AMBULATORY_CARE_PROVIDER_SITE_OTHER): Payer: Medicaid Other | Admitting: Student

## 2016-02-28 VITALS — BP 131/67 | HR 84 | Temp 98.1°F | Wt 126.0 lb

## 2016-02-28 DIAGNOSIS — M816 Localized osteoporosis [Lequesne]: Secondary | ICD-10-CM

## 2016-02-28 DIAGNOSIS — M810 Age-related osteoporosis without current pathological fracture: Secondary | ICD-10-CM | POA: Diagnosis not present

## 2016-02-28 MED ORDER — ALENDRONATE SODIUM 70 MG PO TABS: 70.0000 mg | ORAL_TABLET | ORAL | 11 refills | Status: DC

## 2016-02-28 MED ORDER — CALCIUM CARBONATE-VITAMIN D3 600-400 MG-UNIT PO TABS: ORAL_TABLET | ORAL | 3 refills | Status: DC

## 2016-02-28 NOTE — Patient Instructions (Signed)
It was great seeing you today! We have addressed the following issues today  1. Osteoporosis: I have sent two prescriptions to the pharmacy:  -Alendronate: Take this medication once a week with a full glass of water 30 minutes before breakfast in the morning. Please stay upright for about 30 minutes to 1 hour after taking this medication.  -I have also sent a prescription for calcium/vitamin D. You can take 2 tablets once or 1 tablet twice a day.    If we did any lab work today, and the results require attention, either me or my nurse will get in touch with you. If everything is normal, you will get a letter in mail. If you don't hear from us in two weeks, please give us a call. Otherwise, we look forward to seeing you again at your next visit. If you have any questions or concerns before then, please call the clinic at [phone].   Please bring all your medications to every doctors visit   Sign up for My Chart to have easy access to your labs results, and communication with your Primary care physician.     Please check-out at the front desk before leaving the clinic.    Take Care,     Osteoporosis Osteoporosis is the thinning and loss of density in the bones. Osteoporosis makes the bones more brittle, fragile, and likely to break (fracture). Over time, osteoporosis can cause the bones to become so weak that they fracture after a simple fall. The bones most likely to fracture are the bones in the hip, wrist, and spine. CAUSES  The exact cause is not known. RISK FACTORS Anyone can develop osteoporosis. You may be at greater risk if you have a family history of the condition or have poor nutrition. You may also have a higher risk if you are:   Female.   70 years old or older.  A smoker.  Not physically active.   White or Asian.  Slender. SIGNS AND SYMPTOMS  A fracture might be the first sign of the disease, especially if it results from a fall or injury that would not  usually cause a bone to break. Other signs and symptoms include:   Low back and neck pain.  Stooped posture.  Height loss. DIAGNOSIS  To make a diagnosis, your health care provider may:  Take a medical history.  Perform a physical exam.  Order tests, such as:  A bone mineral density test.  A dual-energy X-ray absorptiometry test. TREATMENT  The goal of osteoporosis treatment is to strengthen your bones to reduce your risk of a fracture. Treatment may involve:  Making lifestyle changes, such as:  Eating a diet rich in calcium.  Doing weight-bearing and muscle-strengthening exercises.  Stopping tobacco use.  Limiting alcohol intake.  Taking medicine to slow the process of bone loss or to increase bone density.  Monitoring your levels of calcium and vitamin D. HOME CARE INSTRUCTIONS  Include calcium and vitamin D in your diet. Calcium is important for bone health, and vitamin D helps the body absorb calcium.  Perform weight-bearing and muscle-strengthening exercises as directed by your health care provider.  Do not use any tobacco products, including cigarettes, chewing tobacco, and electronic cigarettes. If you need help quitting, ask your health care provider.  Limit your alcohol intake.  Take medicines only as directed by your health care provider.  Keep all follow-up visits as directed by your health care provider. This is important.  Take precautions at  home to lower your risk of falling, such as:  Keeping rooms well lit and clutter free.  Installing safety rails on stairs.  Using rubber mats in the bathroom and other areas that are often wet or slippery. SEEK IMMEDIATE MEDICAL CARE IF:  You fall or injure yourself.    This information is not intended to replace advice given to you by your health care provider. Make sure you discuss any questions you have with your health care provider.   Document Released: 01/18/2005 Document Revised: 05/01/2014  Document Reviewed: 09/18/2013 Elsevier Interactive Patient Education [employer]2016 [employer]vier Inc.

## 2016-02-28 NOTE — Assessment & Plan Note (Deleted)
02/16/2016: BMD measured at AP Spine L1-L4 is 0.843 g/cm2 with a T-score of -2.8.

## 2016-02-28 NOTE — Assessment & Plan Note (Addendum)
BMD measured at AP Spine L1-L4 is 0.843 g/cm2 with a T-score of -2.8.  -Alendronate 70 mg weekly. Advised patient to take to empty stomach with a glass of water. Advised her to stay upright for 30 minutes -1 hour after taking this medication -Calcium/vitamin D -Eeight resistance exercises 3 or 4 times per week.

## 2016-02-28 NOTE — Progress Notes (Signed)
   Subjective:    Patient ID: Kimberly House is a 70 y.o. old female. Patient here to follow-up on her DEXA scan  HPI #Osteoporosis: BMD measured at AP Spine L1-L4 is 0.843 g/cm2 with a T-score of -2.8. She has no history of heartburn, reflux or renal disease. Denies back pain. She does not take calcium supplements. She denies taking dairy products. BMP within normal limits about a month ago. Creatinine 0.94.   SH: Denies smoking, drinking alcohol or recreational drug use  Review of Systems Per HPI Objective:   Vitals:   02/28/16 1340  BP: 131/67  Pulse: 84  Temp: 98.1 F (36.7 C)  TempSrc: Oral  Weight: 57.2 kg (126 lb)    GEN: appears well, no apparent distress. CVS: RRR, normal s1 and s2, no edema RESP: no increased work of breathing GI: Bowel sounds present and normal, soft, non-tender MSK: Mild thoracic kyphosis. No tenderness to palpation over his spines SKIN: No apparent skin lesion NEURO: alert and oriented appropriately, no gross defecits  PSYCH: appropriate mood and affect     Assessment & Plan:  Osteoporosis BMD measured at AP Spine L1-L4 is 0.843 g/cm2 with a T-score of -2.8.  -Alendronate 70 mg weekly. Advised patient to take to empty stomach with a glass of water. Advised her to stay upright for 30 minutes -1 hour after taking this medication -Calcium/vitamin D -Eeight resistance exercises 3 or 4 times per week.

## 2016-03-21 ENCOUNTER — Encounter: Payer: Self-pay | Admitting: Student

## 2016-05-26 ENCOUNTER — Other Ambulatory Visit: Payer: Self-pay | Admitting: Student

## 2016-05-26 DIAGNOSIS — E119 Type 2 diabetes mellitus without complications: Secondary | ICD-10-CM

## 2016-08-09 ENCOUNTER — Other Ambulatory Visit: Payer: Self-pay | Admitting: Student

## 2016-08-09 DIAGNOSIS — E119 Type 2 diabetes mellitus without complications: Secondary | ICD-10-CM

## 2016-08-11 ENCOUNTER — Ambulatory Visit (INDEPENDENT_AMBULATORY_CARE_PROVIDER_SITE_OTHER): Payer: Medicaid Other | Admitting: Student

## 2016-08-11 ENCOUNTER — Other Ambulatory Visit: Payer: Self-pay | Admitting: Student

## 2016-08-11 VITALS — BP 120/68 | HR 79 | Temp 97.5°F | Ht 64.0 in | Wt 125.4 lb

## 2016-08-11 DIAGNOSIS — M810 Age-related osteoporosis without current pathological fracture: Secondary | ICD-10-CM | POA: Diagnosis not present

## 2016-08-11 DIAGNOSIS — E119 Type 2 diabetes mellitus without complications: Secondary | ICD-10-CM

## 2016-08-11 DIAGNOSIS — Z8639 Personal history of other endocrine, nutritional and metabolic disease: Secondary | ICD-10-CM | POA: Diagnosis not present

## 2016-08-11 DIAGNOSIS — Z0000 Encounter for general adult medical examination without abnormal findings: Secondary | ICD-10-CM | POA: Diagnosis not present

## 2016-08-11 DIAGNOSIS — Z1159 Encounter for screening for other viral diseases: Secondary | ICD-10-CM

## 2016-08-11 DIAGNOSIS — Z23 Encounter for immunization: Secondary | ICD-10-CM

## 2016-08-11 LAB — POCT GLYCOSYLATED HEMOGLOBIN (HGB A1C): HEMOGLOBIN A1C: 7.4

## 2016-08-11 NOTE — Patient Instructions (Addendum)
It was great seeing you today! We have addressed the following issues today 1. Diabetes: your A1c 7.4% which is good. Continue metformin. Keep up with the exercise. Please comeback and see [place] in 6 months.   If we did any lab work today, and the results require attention, either me or my nurse will get in touch with you. If everything is normal, you will get a letter in mail and a message via . If you don't hear from [place] in two weeks, please give [place] a call. Otherwise, we look forward to seeing you again at your next visit. If you have any questions or concerns before then, please call the clinic at [phone].  Please bring all your medications to every doctors visit  Sign up for My Chart to have easy access to your labs results, and communication with your Primary care physician.    Please check-out at the front desk before leaving the clinic.    Take Care,   Dr. Alanda Slim

## 2016-08-12 ENCOUNTER — Other Ambulatory Visit: Payer: Self-pay | Admitting: Student

## 2016-08-12 DIAGNOSIS — R030 Elevated blood-pressure reading, without diagnosis of hypertension: Secondary | ICD-10-CM

## 2016-08-12 DIAGNOSIS — E875 Hyperkalemia: Secondary | ICD-10-CM

## 2016-08-12 DIAGNOSIS — Z8639 Personal history of other endocrine, nutritional and metabolic disease: Secondary | ICD-10-CM

## 2016-08-12 LAB — BASIC METABOLIC PANEL
BUN / CREAT RATIO: 27 (ref 12–28)
BUN: 24 mg/dL (ref 8–27)
CO2: 22 mmol/L (ref 18–29)
CREATININE: 0.9 mg/dL (ref 0.57–1.00)
Calcium: 10.3 mg/dL (ref 8.7–10.3)
Chloride: 101 mmol/L (ref 96–106)
GFR calc Af Amer: 75 mL/min/{1.73_m2} (ref >59)
GFR, EST NON AFRICAN AMERICAN: 65 mL/min/{1.73_m2} (ref >59)
GLUCOSE: 95 mg/dL (ref 65–99)
Potassium: 5.3 mmol/L — ABNORMAL HIGH (ref 3.5–5.2)
SODIUM: 144 mmol/L (ref 134–144)

## 2016-08-12 LAB — HEPATITIS C ANTIBODY: Hep C Virus Ab: <0.1 {s_co_ratio} (ref 0.0–0.9)

## 2016-08-12 MED ORDER — LISINOPRIL-HYDROCHLOROTHIAZIDE 10-12.5 MG PO TABS: 1.0000 | ORAL_TABLET | Freq: Every day | ORAL | 3 refills | Status: DC

## 2016-08-12 NOTE — Progress Notes (Signed)
Called patient to discuss about her BMP. K elevated at 5.3. Recommended stopping lisinopril 20 mg. Sent Rx for lisinopril/HCTZ 10/12.5 mg daily. Recommended calling the office to schedule Lab visit in a week for repeat BMP, TSH and T4. Patient voices understanding and agrees with the plan. Future labs ordered.

## 2016-08-12 NOTE — Assessment & Plan Note (Signed)
Didn't get TSH today. Will try add on labs if possible.

## 2016-08-12 NOTE — Assessment & Plan Note (Addendum)
Stable. A1c 7.4%  Continue metformin   Diabetic foot exam normal  Got her pneumonia vaccine today  Recommended visiting eye doctor for her annual eye exam  Recommended scheduling dental apt   Continue ACEi and ASA  BMP today

## 2016-08-12 NOTE — Assessment & Plan Note (Signed)
On alendronate and vitD/Ca. Doing resistant exercise as well.  Continue current mgt

## 2016-08-12 NOTE — Progress Notes (Signed)
Subjective:    Kimberly House is a 71 y.o. old female here for follow up on her diabetes.   HPI Diabetes: A1c 7.4% today. She reports taking her metformin 500 mg twice a day, walking every day, doing some resistant exercise. She denies problem with her medication. Denies symptoms of hypo- or hyperglycemia. Hasn't see an eye doctor recently. She says last visit was about two years ago.   Osteoporosis: started on Alendronate about 6 weeks ago. Tolerating it. She is on vitD/Ca. Does resistant exercise. No GI symptoms.   PMH/Problem List: has Health maintenance examination; History of hyperthyroidism; Elevated BP; Diabetes mellitus (HCC); Hyperlipidemia with target LDL less than 100; Vitamin D deficiency; H/O total vaginal hysterectomy; and Osteoporosis on her problem list.   has a past medical history of Hyperthyroidism and Pulmonary TB (1980).  FH:  Family History  Problem Relation Age of Onset  . Thyroid disease Mother   . Asthma Mother     [facility] Social History  Substance Use Topics  . Smoking status: Never Smoker  . Smokeless tobacco: Not on file  . Alcohol use No    Review of Systems  Constitutional: Negative.  Negative for appetite change, fever and unexpected weight change.  HENT: Negative for dental problem, hearing loss, sore throat and trouble swallowing.   Eyes: Negative for visual disturbance.  Respiratory: Negative for cough, chest tightness, shortness of breath and wheezing.   Cardiovascular: Negative for chest pain, palpitations and leg swelling.  Gastrointestinal: Negative for abdominal pain, blood in stool and constipation.  Endocrine: Negative for cold intolerance and heat intolerance.  Genitourinary: Negative for dyspareunia, dysuria, genital sores, hematuria and vaginal bleeding.  Musculoskeletal: Negative for arthralgias, joint swelling and myalgias.  Skin: Negative for rash.  Neurological: Negative for weakness, numbness and headaches.  Hematological: Negative for  adenopathy. Does not bruise/bleed easily.  Psychiatric/Behavioral: Negative for dysphoric mood and sleep disturbance. The patient is not nervous/anxious.       Objective:     Vitals:   08/11/16 1345  BP: 120/68  Pulse: 79  Temp: 97.5 F (36.4 C)  TempSrc: Oral  SpO2: 98%  Weight: 125 lb 6.4 oz (56.9 kg)  Height:  (1.626 m)    Physical Exam GEN: appears well, no apparent distress. HEM: negative for cervical or periauricular lymphadenopathies CVS: RRR, nl S1&S2, no murmurs, no edema,  2+ DP & PT pulses bilaterally RESP: speaks in full sentence, no IWOB, good air movement bilaterally, CTAB GI: BS present & normal, soft, NTND, no guarding MSK: no focal tenderness or notable swelling SKIN: no apparent skin lesion NEURO: alert and oiented appropriately, no gross defecits  PSYCH: euthymic mood with congruent affect    Assessment and Plan:  Diabetes mellitus (HCC) Stable. A1c 7.4%  Continue metformin   Diabetic foot exam normal  Got her pneumonia vaccine today  Recommended visiting eye doctor for her annual eye exam  Recommended scheduling dental apt   Continue ACEi and ASA  BMP today  Osteoporosis On alendronate and vitD/Ca. Doing resistant exercise as well.  Continue current mgt   History of hyperthyroidism Didn't get TSH today. Will try add on labs if possible.  Health maintenance examination Hep C screening ordered today.  Recommended eye and dental exams  Orders Placed This Encounter  Procedures  . Pneumococcal polysaccharide vaccine 23-valent greater than or equal to 2yo subcutaneous/IM  . Basic metabolic panel  . Hepatitis C antibody  . HgB A1c    Return in about 6 months (around  02/10/2017) for Annual physical.  Almon Hercules, MD 08/12/16 Pager: [phone]

## 2016-08-12 NOTE — Assessment & Plan Note (Signed)
Hep C screening ordered today.  Recommended eye and dental exams

## 2016-08-17 ENCOUNTER — Ambulatory Visit: Payer: Medicaid Other | Admitting: Student

## 2016-08-17 ENCOUNTER — Ambulatory Visit (INDEPENDENT_AMBULATORY_CARE_PROVIDER_SITE_OTHER): Payer: Medicaid Other | Admitting: Student

## 2016-08-17 VITALS — BP 102/70 | HR 94 | Temp 98.2°F | Wt 122.8 lb

## 2016-08-17 DIAGNOSIS — Z8639 Personal history of other endocrine, nutritional and metabolic disease: Secondary | ICD-10-CM

## 2016-08-17 DIAGNOSIS — Z79899 Other long term (current) drug therapy: Secondary | ICD-10-CM

## 2016-08-17 DIAGNOSIS — I10 Essential (primary) hypertension: Secondary | ICD-10-CM | POA: Diagnosis present

## 2016-08-17 NOTE — Assessment & Plan Note (Addendum)
No signs and symptoms of hypo-or hyperthyroidism.  TSH and Free T4 today.

## 2016-08-17 NOTE — Progress Notes (Signed)
  Subjective:    Kimberly House is a 71 y.o. old female here for follow up on hypertension  HPI Hypertension: changed her medication from lisinopril 20 mg daily to lisinopril/HCTZ 10/12.5 mg daily about a week ago due mild hyperkalemia. She has been tolerating this well. Denies headache, dizziness, chest pain or palpation.   PMH/Problem List: has Health maintenance examination; History of hyperthyroidism; Essential hypertension; Diabetes mellitus (HCC); Hyperlipidemia with target LDL less than 100; Vitamin D deficiency; H/O total vaginal hysterectomy; and Osteoporosis on her problem list.   has a past medical history of Hyperthyroidism and Pulmonary TB (1980).  FH:  Family History  Problem Relation Age of Onset  . Thyroid disease Mother   . Asthma Mother     [facility] Social History  Substance Use Topics  . Smoking status: Never Smoker  . Smokeless tobacco: Not on file  . Alcohol use No    Review of Systems Review of systems negative except for pertinent positives and negatives in history of present illness above.     Objective:     Vitals:   08/17/16 1420  BP: 102/70  Pulse: 94  Temp: 98.2 F (36.8 C)  TempSrc: Oral  SpO2: 97%  Weight: 55.7 kg (122 lb 12.8 oz)    Physical Exam GEN: appears well, no apparent distress. HEM: negative for cervical or periauricular lymphadenopathies CVS: RRR, nl S1&S2, no murmurs, no edema,  2+ DP & PT pulses bilaterally RESP: speaks in full sentence, no IWOB, CTAB MSK: no focal tenderness or notable swelling SKIN: no apparent skin lesion NEURO: alert and oiented appropriately, no gross defecits  PSYCH: euthymic mood with congruent affect    Assessment and Plan:  Essential hypertension Controlled. BP on low side of normal but she is tolerating it well. Denies dizziness. Will continue lisinopril/HCTZ 10/12.5 mg daily. Patient to call and let us know if she feel dizziness, fatigue or other symptom concerning to her. Will repeat BMP today.    History of hyperthyroidism No signs and symptoms of hypo-or hyperthyroidism.  TSH and Free T4 today.   Orders Placed This Encounter  Procedures  . Basic metabolic panel    Return in about 6 months (around 02/16/2017) for DM.  Almon Hercules, MD 08/18/16 Pager: [phone]

## 2016-08-17 NOTE — Patient Instructions (Addendum)
It was great seeing you today! We have addressed the following issues today 1. Blood pressure: your blood pressure is 102/70 today. Continue taking your medication. Please let us know if you feel dizzy or lightheaded or other symptoms concerning to you. Otherwise, follow up in 6 months for your diabetes.   If we did any lab work today, and the results require attention, either me or my nurse will get in touch with you. If everything is normal, you will get a letter in mail and a message via . If you don't hear from [place] in two weeks, please give [place] a call. Otherwise, we look forward to seeing you again at your next visit. If you have any questions or concerns before then, please call the clinic at [phone].  Please bring all your medications to every doctors visit  Sign up for My Chart to have easy access to your labs results, and communication with your Primary care physician.    Please check-out at the front desk before leaving the clinic.    Take Care,   Dr. Alanda Slim

## 2016-08-17 NOTE — Assessment & Plan Note (Addendum)
Controlled. BP on low side of normal but she is tolerating it well. Denies dizziness. Will continue lisinopril/HCTZ 10/12.5 mg daily. Patient to call and let us know if she feel dizziness, fatigue or other symptom concerning to her. Will repeat BMP today.

## 2016-08-18 LAB — BASIC METABOLIC PANEL
BUN/Creatinine Ratio: 24 (ref 12–28)
BUN: 24 mg/dL (ref 8–27)
CALCIUM: 11.1 mg/dL — AB (ref 8.7–10.3)
CO2: 21 mmol/L (ref 18–29)
CREATININE: 0.98 mg/dL (ref 0.57–1.00)
Chloride: 98 mmol/L (ref 96–106)
GFR, EST AFRICAN AMERICAN: 68 mL/min/{1.73_m2} (ref >59)
GFR, EST NON AFRICAN AMERICAN: 59 mL/min/{1.73_m2} — AB (ref >59)
Glucose: 123 mg/dL — ABNORMAL HIGH (ref 65–99)
Potassium: 4.8 mmol/L (ref 3.5–5.2)
Sodium: 141 mmol/L (ref 134–144)

## 2016-08-22 LAB — SPECIMEN STATUS REPORT

## 2016-08-23 ENCOUNTER — Other Ambulatory Visit: Payer: Self-pay | Admitting: Student

## 2016-08-23 ENCOUNTER — Telehealth: Payer: Self-pay | Admitting: Student

## 2016-08-23 DIAGNOSIS — I10 Essential (primary) hypertension: Secondary | ICD-10-CM

## 2016-08-23 DIAGNOSIS — M816 Localized osteoporosis [Lequesne]: Secondary | ICD-10-CM

## 2016-08-23 MED ORDER — AMLODIPINE BESYLATE 5 MG PO TABS: 5.0000 mg | ORAL_TABLET | Freq: Every day | ORAL | 3 refills | Status: DC

## 2016-08-23 NOTE — Telephone Encounter (Signed)
Will forward to PCP.  Jaymon Dudek L, RN  

## 2016-08-23 NOTE — Telephone Encounter (Signed)
Pt states PCP switched her to lisinopril-hydrochlorothiazide and pt is bloated, no appetite, and is nauseous. Pt would like PCP to call her cell phone [phone]. ep

## 2016-08-24 NOTE — Progress Notes (Signed)
Patient was recently switched from lisinopril 20 mg to lisinopril/HCTZ 10/12.5 due to hyperkalemia. After she took the later for about a week, she started feeling G.I. upset (bloating and poor appetite) that has improved with stopping the medication. Then, she called the office to know if she can go back to lisinopril 20mg . She also states that she will be traveling to [place] in about four days and she would be there for three months. I explained my concern about putting her back on lisinopril 20 mg without close follow up. I suggested trying Amlodipone 5 mg and checking her BP. I advised patient to call the office if BP is more than 140/90 or if she have side effects concerning to her. Patient voiced understanding and agreed to do so. I have sent a prescription for amlodipine 5 mg, 90, two refills to her pharmacy.

## 2017-02-14 ENCOUNTER — Ambulatory Visit (INDEPENDENT_AMBULATORY_CARE_PROVIDER_SITE_OTHER): Payer: Medicaid Other | Admitting: Student

## 2017-02-14 ENCOUNTER — Encounter: Payer: Self-pay | Admitting: Student

## 2017-02-14 VITALS — BP 124/68 | HR 90 | Temp 97.9°F | Ht 64.0 in | Wt 125.6 lb

## 2017-02-14 DIAGNOSIS — E119 Type 2 diabetes mellitus without complications: Secondary | ICD-10-CM | POA: Diagnosis not present

## 2017-02-14 DIAGNOSIS — I10 Essential (primary) hypertension: Secondary | ICD-10-CM | POA: Diagnosis not present

## 2017-02-14 DIAGNOSIS — Z0000 Encounter for general adult medical examination without abnormal findings: Secondary | ICD-10-CM | POA: Diagnosis present

## 2017-02-14 DIAGNOSIS — Z8639 Personal history of other endocrine, nutritional and metabolic disease: Secondary | ICD-10-CM

## 2017-02-14 DIAGNOSIS — Z23 Encounter for immunization: Secondary | ICD-10-CM | POA: Diagnosis not present

## 2017-02-14 LAB — POCT GLYCOSYLATED HEMOGLOBIN (HGB A1C): Hemoglobin A1C: 8

## 2017-02-14 MED ORDER — METFORMIN HCL 1000 MG PO TABS: 1000.0000 mg | ORAL_TABLET | Freq: Two times a day (BID) | ORAL | 3 refills | Status: DC

## 2017-02-14 NOTE — Progress Notes (Deleted)
  Subjective:    Kimberly House is a 71 y.o. old female here ***  HPI  PMH/Problem List: has Health maintenance examination; History of hyperthyroidism; Essential hypertension; Diabetes mellitus (HCC); Hyperlipidemia with target LDL less than 100; Vitamin D deficiency; H/O total vaginal hysterectomy; and Osteoporosis on her problem list.   has a past medical history of Hyperthyroidism and Pulmonary TB (1980).  FH:  Family History  Problem Relation Age of Onset  . Thyroid disease Mother   . Asthma Mother     [facility] Social [facility]  Substance Use Topics  . Smoking status: Never Smoker  . Smokeless tobacco: Not on file  . Alcohol use No    Review of Systems Review of systems negative except for pertinent positives and negatives in history of present illness above.     Objective:     There were no vitals filed for this visit. There is no height or weight on file to calculate BMI.  Physical Exam GEN: appears well***, no apparent distress. Head: normocephalic and atraumatic  Eyes: conjunctiva without injection, sclera anicteric Oropharynx: mmm without erythema or exudation HEM: negative for cervical or periauricular lymphadenopathies ENDO: negative thyromegally *** CVS: RRR, nl S1&S2, no murmurs, no edema RESP: no IWOB, good air movement bilaterally, CTAB GI: BS present & normal, soft, NTND GU: no suprapubic or CVA tenderness*** MSK: no focal tenderness or notable swelling SKIN: no apparent skin lesion *** NEURO: alert and oiented appropriately, no gross deficits  PSYCH: euthymic mood with congruent affect ***    Assessment and Plan:     No Follow-up on file.  Almon Herculesaye T Tawny Raspberry, MD 02/14/17 Pager: [phone]

## 2017-02-14 NOTE — Assessment & Plan Note (Signed)
A1c to 8.0%, previously 7.4%. Will increase metformin from 500 mg twice a day to 1 g twice a day. Follow-up in 6 months.

## 2017-02-14 NOTE — Patient Instructions (Signed)
It was great seeing you today! We have addressed the following issues today 1. Shoulder pain: tried exercise regimen we discussed 4 times a day 2.   Diabetes: your A1c is 8.0% today. We have increased the metformin to 1000 mg twice a day. Please come back and see us in 6 months for follow-up on this. Keep up the exercise 3.   Annual eye exam: please call the ophthalmologist and schedule for your annual eye exam 4.   Colon cancer screening: please call one of the numbers below to schedule for your colonoscopy 5.   Dental checkup: please call one of the numbers we gave you to schedule for the dental checkup  If we did any lab work today, and the results require attention, either me or my nurse will get in touch with you. If everything is normal, you will get a letter in mail and a message via . If you don't hear from us in two weeks, please give us a call. Otherwise, we look forward to seeing you again at your next visit. If you have any questions or concerns before then, please call the clinic at [phone].  Please bring all your medications to every doctors visit  Sign up for My Chart to have easy access to your labs results, and communication with your Primary care physician.    Please check-out at the front desk before leaving the clinic.    Take Care,   Dr. Alanda SlimGonfa              Colon Cancer  People with early colon cancer usually have no warning signs or symptoms.  If found early, most patients can be cured, but if found when it has already spread, the chance of survival is not as good.  Colon cancer is the second most common cause of concern is in the [place] with over 56,000 deaths from colon cancer in 2005  Colon cancer is a common, treatable disease. Screening tests can find a cancer  early, before you have symptoms, and make it more likely that you will survive the disease.  Who needs to be tested? If you are age 71-75 yrs, you should be tested for colon cancer.  Ways to be  tested:  A colonoscopy the best test to detect colon cancer. It requires you to drink a bowel preparation to clean out your colon before the test. During this test, a tube with a camera inserted into your rectum and examines your entire colon. You can be given medicine to make you sleepy during the exam. Therefore, you will not be able to drive immediately after the test. There is a small risk of bowel injury during the test.   Stool cards that you can take home and take a sample of your stool is another option. The cards are not as good as colonoscopy at detecting cancer, but the tests are easier and cheaper.   To schedule the colonoscopy, you can call one of the 3 options below:  [facility]. Phone number: [phone]  [facility]. Phone number: [phone]  [facility]: Phone number [phone]

## 2017-02-14 NOTE — Assessment & Plan Note (Signed)
History, problem list and medication list reviewed and updated. Chronic medical conditions stable. Encouraged her to schedule for colonoscopy and gave her phone numbers so that she can call and schedule. Also encouraged her to get her annual eye exam and regular dental visits. She reports daily walking at least for 30 minutes. Received a flu vaccine today.

## 2017-02-14 NOTE — Progress Notes (Signed)
Subjective:   Chief Complaint  Patient presents with  . Annual Exam  . Diabetes  . Hypertension   HPI Kimberly House is a 71 y.o. old female here  for annual exam.  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    The patient has regular exercise: yes.   Enough vegetables and fruits: yes.  Smokes cigarette: no Drinks EtOH: no Drug use: no Patient takes ASA: yes.  Patient takes vitD & Ca: yes. Ever been transfused or tattooed?: no.  The patient is not sexually active.  Domestic violence: no.  Advance directive: no. MOST: no.   History of depression:no.  Patient dental home: yes.   Immunizations  Needs influenza vaccine: yes.  Needs HPV (Women until age 71): not applicable.  Needs Shingrix (all >5670yrs of age): yes.  Needs Tdap: no.  Needs Pneumococcal: no. Has both   Screening Need colon cancer screening: yes. Need breast cancer ccreening: no. Need cervical cancer Screening: no. Need lung cancer screening:no. At risk for skin cancer: no. Need HCV Screening: no. Need STI Screening: no.  PMH/Problem List: has Health maintenance examination; History of hyperthyroidism; Essential hypertension; Diabetes mellitus (HCC); Hyperlipidemia with target LDL less than 100; Vitamin D deficiency; H/O total vaginal hysterectomy; and Osteoporosis on her problem list.   has a past medical history of Hyperthyroidism and Pulmonary TB (1980).  [facility]  Family History  Problem Relation Age of Onset  . Thyroid disease Mother   . Asthma Mother    Family history of heart disease before age of 71 yrs: no. Family history of stroke: no. Family history of cancer: no.  [facility] Social History  Substance Use Topics  . Smoking status: Never Smoker  . Smokeless tobacco: Never Used  . Alcohol use No     Review of Systems  Constitutional: Negative for appetite change, diaphoresis, fatigue, fever and unexpected  weight change.  HENT: Negative for dental problem and trouble swallowing.   Eyes: Negative for visual disturbance.  Respiratory: Negative for cough, chest tightness and shortness of breath.   Cardiovascular: Negative for chest pain, palpitations and leg swelling.  Gastrointestinal: Negative for abdominal pain and blood in stool.  Endocrine: Negative for cold intolerance, heat intolerance, polydipsia, polyphagia and polyuria.  Genitourinary: Negative for dysuria, hematuria and menstrual problem.  Musculoskeletal: Positive for arthralgias. Negative for myalgias.       Chronic left shoulder pain  Skin: Negative for rash.  Neurological: Negative for dizziness and light-headedness.  Hematological: Negative for adenopathy. Does not bruise/bleed easily.  Psychiatric/Behavioral: Negative for dysphoric mood.    Objective:   Physical Exam Vitals:   02/14/17 1400  BP: 124/68  Pulse: 90  Temp: 97.9 F (36.6 C)  TempSrc: Oral  SpO2: 98%  Weight: 125 lb 9.6 oz (57 kg)  Height: 5\' 4"  (1.626 m)   Body mass index is 21.56 kg/m.  GEN: appears well, no apparent distress. Head: normocephalic and atraumatic  Eyes: conjunctiva without injection, sclera anicteric Ears: external ear and ear canal normal Nares: no rhinorrhea, congestion or erythema Oropharynx: mmm without erythema or exudation HEM: negative for cervical or periauricular lymphadenopathies CVS: RRR, nl s1 & s2, no murmurs RESP: no IWOB, good air movement bilaterally, CTAB GI: BS present & normal, soft, NTND GU: no suprapubic or CVA tenderness MSK: no focal tenderness or notable swelling SKIN: no apparent skin lesion ENDO: negative thyromegally NEURO: alert and oiented appropriately, no gross deficits  PSYCH:  euthymic mood with congruent affect    Assessment & Plan:  1. Annual physical exam: history, problem list and medication list reviewed and updated. Chronic medical conditions stable. Encouraged her to schedule for  colonoscopy and gave her phone numbers so that she can call and schedule. Also encouraged her to get her annual eye exam and regular dental visits. She reports daily walking at least for 30 minutes. Received a flu vaccine today. Gave her advance directive packets  2. Type 2 diabetes mellitus without complication, unspecified whether long term insulin use (HCC): A1c to 8.0%, previously 7.4%. Will increase metformin from 500 mg twice a day to 1 g twice a day. Follow-up in 6 months.   3. Essential hypertension: well controlled on amlodipine -BMP today -Continue amlodipine 5 mg daily  4. History of hyperthyroidism status post thyroidectomy. No signs and symptoms of hyper or hypothyroidism - TSH - T4, Free  5. Hypercalcemia: calcium 11.1 on her last BMP -Repeat BMP - Parathyroid hormone, intact (no Ca)  6. Need for immunization against influenza - Flu Vaccine QUAD 36+ mos IM  Candelaria Stagers PGY-3 Pager [phone] 02/14/17  5:38 PM

## 2017-02-15 ENCOUNTER — Other Ambulatory Visit: Payer: Self-pay | Admitting: Student

## 2017-02-15 DIAGNOSIS — E119 Type 2 diabetes mellitus without complications: Secondary | ICD-10-CM

## 2017-02-15 DIAGNOSIS — E785 Hyperlipidemia, unspecified: Secondary | ICD-10-CM

## 2017-02-15 DIAGNOSIS — M816 Localized osteoporosis [Lequesne]: Secondary | ICD-10-CM

## 2017-02-15 LAB — PARATHYROID HORMONE, INTACT (NO CA): PTH: 34 pg/mL (ref 15–65)

## 2017-02-15 LAB — BASIC METABOLIC PANEL
BUN / CREAT RATIO: 18 (ref 12–28)
BUN: 14 mg/dL (ref 8–27)
CALCIUM: 9.7 mg/dL (ref 8.7–10.3)
CO2: 24 mmol/L (ref 20–29)
Chloride: 102 mmol/L (ref 96–106)
Creatinine, Ser: 0.76 mg/dL (ref 0.57–1.00)
GFR, EST AFRICAN AMERICAN: 91 mL/min/{1.73_m2} (ref >59)
GFR, EST NON AFRICAN AMERICAN: 79 mL/min/{1.73_m2} (ref >59)
Glucose: 108 mg/dL — ABNORMAL HIGH (ref 65–99)
Potassium: 4.9 mmol/L (ref 3.5–5.2)
Sodium: 140 mmol/L (ref 134–144)

## 2017-02-15 LAB — TSH: TSH: 3.97 u[IU]/mL (ref 0.450–4.500)

## 2017-02-15 LAB — T4, FREE: Free T4: 1.13 ng/dL (ref 0.82–1.77)

## 2017-06-06 LAB — HM DIABETES EYE EXAM

## 2017-06-29 ENCOUNTER — Encounter: Payer: Self-pay | Admitting: Student

## 2017-08-28 ENCOUNTER — Other Ambulatory Visit: Payer: Self-pay

## 2017-08-28 DIAGNOSIS — E785 Hyperlipidemia, unspecified: Secondary | ICD-10-CM

## 2017-08-28 DIAGNOSIS — E119 Type 2 diabetes mellitus without complications: Secondary | ICD-10-CM

## 2017-08-28 MED ORDER — ATORVASTATIN CALCIUM 40 MG PO TABS: 40.0000 mg | ORAL_TABLET | Freq: Every day | ORAL | 3 refills | Status: DC

## 2017-08-29 ENCOUNTER — Other Ambulatory Visit: Payer: Self-pay

## 2017-08-29 DIAGNOSIS — I10 Essential (primary) hypertension: Secondary | ICD-10-CM

## 2017-08-29 MED ORDER — AMLODIPINE BESYLATE 5 MG PO TABS: 5.0000 mg | ORAL_TABLET | Freq: Every day | ORAL | 3 refills | Status: DC

## 2017-09-07 ENCOUNTER — Other Ambulatory Visit: Payer: Self-pay

## 2017-09-07 ENCOUNTER — Encounter: Payer: Self-pay | Admitting: Student

## 2017-09-07 ENCOUNTER — Ambulatory Visit: Payer: Medicaid Other | Admitting: Student

## 2017-09-07 VITALS — BP 122/78 | HR 91 | Temp 97.9°F | Wt 122.0 lb

## 2017-09-07 DIAGNOSIS — Z1211 Encounter for screening for malignant neoplasm of colon: Secondary | ICD-10-CM | POA: Diagnosis not present

## 2017-09-07 DIAGNOSIS — R809 Proteinuria, unspecified: Secondary | ICD-10-CM | POA: Insufficient documentation

## 2017-09-07 DIAGNOSIS — Z79899 Other long term (current) drug therapy: Secondary | ICD-10-CM | POA: Diagnosis not present

## 2017-09-07 DIAGNOSIS — E119 Type 2 diabetes mellitus without complications: Secondary | ICD-10-CM | POA: Diagnosis present

## 2017-09-07 DIAGNOSIS — I10 Essential (primary) hypertension: Secondary | ICD-10-CM | POA: Diagnosis not present

## 2017-09-07 LAB — POCT GLYCOSYLATED HEMOGLOBIN (HGB A1C): HEMOGLOBIN A1C: 7.1

## 2017-09-07 LAB — POCT UA - MICROALBUMIN
Creatinine, POC: 50 mg/dL
MICROALBUMIN (UR) POC: 150 mg/L

## 2017-09-07 NOTE — Patient Instructions (Addendum)
It was great seeing you today! We have addressed the following issues today Diabetes: Congratulations! Your A1c is 7.1% today. It was 8.0%. Your goal A1c is less than 7.5%. See below for more information about A1c.  1. Continue taking your metformin 2. Keep exercising  Colon cancer screening: It is very important that you have colonoscopy for colon cancer screening.  Please see below for more on this.  What is A1c:  The A1C test result reflects your average blood sugar level for the past two to three months. Specifically, the A1C test measures what percentage of your hemoglobin - a protein in red blood cells that carries oxygen - is coated with sugar (glycated). The higher your A1C level, the poorer your blood sugar control and the higher your risk of diabetes complications. Portion Size    Choose healthier foods such as 100% whole grains, vegetables, fruits, beans, nut seeds, olive oil, most vegetable oils, fat-free dietary, wild game and fish.   Avoid sweet tea, other sweetened beverages, soda, fruit juice, cold cereal and milk and trans fat.   Eat at least 3 meals and 1-2 snacks per day.  Aim for no more than 5 hours between eating.  Eat breakfast within one hour of getting up.    Exercise at least 150 minutes per week, including weight resistance exercises 3 or 4 times per week.   Try to lose at least 7-10% of your current body weight.   Hypoglycemia Hypoglycemia is when the sugar (glucose) level in the blood is too low. Symptoms of low blood sugar may include:  Feeling: ? Hungry. ? Worried or nervous (anxious). ? Sweaty and clammy. ? Confused. ? Dizzy. ? Sleepy. ? Sick to your stomach (nauseous).  Having: ? A fast heartbeat. ? A headache. ? A change in your vision. ? Jerky movements that you cannot control (seizure). ? Nightmares. ? Tingling or no feeling (numbness) around the mouth, lips, or tongue.  Having trouble with: ? Talking. ? Paying attention  (concentrating). ? Moving (coordination). ? Sleeping.  Shaking.  Passing out (fainting).  Getting upset easily (irritability).  Low blood sugar can happen to people who have diabetes and people who do not have diabetes. Low blood sugar can happen quickly, and it can be an emergency. Treating Low Blood Sugar Low blood sugar is often treated by eating or drinking something sugary right away. If you can think clearly and swallow safely, follow the 15:15 rule:  Take 15 grams of a fast-acting carb (carbohydrate). Some fast-acting carbs are: ? 1 tube of glucose gel. ? 3 sugar tablets (glucose pills). ? 6-8 pieces of hard candy. ? 4 oz (120 mL) of fruit juice. ? 4 oz (120 mL) of regular (not diet) soda.  Check your blood sugar 15 minutes after you take the carb.  If your blood sugar is still at or below 70 mg/dL (3.9 mmol/L), take 15 grams of a carb again.  If your blood sugar does not go above 70 mg/dL (3.9 mmol/L) after 3 tries, get help right away.  After your blood sugar goes back to normal, eat a meal or a snack within 1 hour.  Treating Very Low Blood Sugar If your blood sugar is at or below 54 mg/dL (3 mmol/L), you have very low blood sugar (severe hypoglycemia). This is an emergency. Do not wait to see if the symptoms will go away. Get medical help right away. Call your local emergency services (911 in the [place] Do not drive yourself to  the hospital. If you have very low blood sugar and you cannot eat or drink, you may need a glucagon shot (injection). A family member or friend should learn how to check your blood sugar and how to give you a glucagon shot. Ask your doctor if you need to have a glucagon shot kit at home. Follow these instructions at home: General instructions  Avoid any diets that cause you to not eat enough food. Talk with your doctor before you start any new diet.  Take over-the-counter and prescription medicines only as told by your doctor.  Limit alcohol to  no more than 1 drink per day for nonpregnant women and 2 drinks per day for men. One drink equals 12 oz of beer, 5 oz of wine, or 1 oz of hard liquor.  Keep all follow-up visits as told by your doctor. This is important. If You Have Diabetes:   Make sure you know the symptoms of low blood sugar.  Always keep a source of sugar with you, such as: ? Sugar. ? Sugar tablets. ? Glucose gel. ? Fruit juice. ? Regular soda (not diet soda). ? Milk. ? Hard candy. ? Honey.  Take your medicines as told.  Follow your exercise and meal plan. ? Eat on time. Do not skip meals. ? Follow your sick day plan when you cannot eat or drink normally. Make this plan ahead of time with your doctor.  Check your blood sugar as often as told by your doctor. Always check before and after exercise.  Share your diabetes care plan with: ? Your work or school. ? People you live with.  Check your pee (urine) for ketones: ? When you are sick. ? As told by your doctor.  Carry a card or wear jewelry that says you have diabetes. If You Have Low Blood Sugar From Other Causes:   Check your blood sugar as often as told by your doctor.  Follow instructions from your doctor about what you cannot eat or drink. Contact a doctor if:  You have trouble keeping your blood sugar in your target range.  You have low blood sugar often. Get help right away if:  You still have symptoms after you eat or drink something sugary.  Your blood sugar is at or below 54 mg/dL (3 mmol/L).  You have jerky movements that you cannot control.  You pass out. These symptoms may be an emergency. Do not wait to see if the symptoms will go away. Get medical help right away. Call your local emergency services (911 in the [place] Do not drive yourself to the hospital. This information is not intended to replace advice given to you by your health care provider. Make sure you discuss any questions you have with your health care  provider. Document Released: 07/05/2009 Document Revised: 09/16/2015 Document Reviewed: 05/14/2015 Elsevier Interactive Patient Education  2018 [place] with early colon cancer usually have no warning signs or symptoms.  If found early, most patients can be cured, but if found when it has already spread, the chance of survival is not as good.  Colon cancer is the second most common cause of concern is in the [place] with over 56,000 deaths from colon cancer in 2005  Colon cancer is a common, treatable disease. Screening tests can find a cancer  early, before you have symptoms, and make it more likely that you will survive the disease.  Who needs to be tested?  If you are age 63-75 yrs, you should be tested for colon cancer.  Ways to be tested:  A colonoscopy the best test to detect colon cancer. It requires you to drink a bowel preparation to clean out your colon before the test. During this test, a tube with a camera inserted into your rectum and examines your entire colon. You can be given medicine to make you sleepy during the exam. Therefore, you will not be able to drive immediately after the test. There is a small risk of bowel injury during the test.   Stool cards that you can take home and take a sample of your stool is another option. The cards are not as good as colonoscopy at detecting cancer, but the tests are easier and cheaper.   To schedule the colonoscopy, you can call one of the 3 options below:  [facility]. Phone number: [phone]  [facility]. Phone number: [phone]  [facility]: Phone number [phone]

## 2017-09-07 NOTE — Progress Notes (Signed)
Subjective:    Kimberly House is a 72 y.o. old female here for follow-up on diabetes.  HPI Diabetes: Last A1c 8.0% about 6 months ago.  A1c down to 7.1% today.  She is on metformin 1000 mg twice daily.  She reports good compliance with this medication.  She denies any issue with this medication.  She walks about 30 minutes daily.  She also does resistance exercise daily.  She was seen by her ophthalmologist about 3 months ago. She denies vision change, numbness or tingling in her legs.  She is taking her atorvastatin.  She is not on ACE inhibitors or ARBs which was discontinued due to hyperkalemia.  She says she is planning to go to [place] to visit family member in a week.  She likes to wait on reinitiation of ACE inhibitor or ARBs until she returns from [place].  PMH/Problem List: has Annual physical exam; History of hyperthyroidism; Essential hypertension; Diabetes mellitus (HCC); Hyperlipidemia with target LDL less than 100; Vitamin D deficiency; H/O total vaginal hysterectomy; Osteoporosis; and Hypercalcemia on their problem list.   has a past medical history of Hyperthyroidism and Pulmonary TB (1980).  FH:  Family History  Problem Relation Age of Onset  . Thyroid disease Mother   . Asthma Mother     SH Social History   Tobacco Use  . Smoking status: Never Smoker  . Smokeless tobacco: Never Used  Substance Use Topics  . Alcohol use: No  . Drug use: No    Review of Systems Review of systems negative except for pertinent positives and negatives in history of present illness above.     Objective:     Vitals:   09/07/17 0835  BP: 122/78  Pulse: 91  Temp: 97.9 F (36.6 C)  TempSrc: Oral  SpO2: 99%  Weight: 122 lb (55.3 kg)   Body mass index is 20.94 kg/m.  Physical Exam  GEN: appears well & comfortable. No apparent distress. CVS: RRR, nl s1 & s2, no murmurs, no edema,  2+ DP pulses bilaterally RESP: no IWOB, good air movement bilaterally, CTAB GI: BS present &  normal, soft, NTND MSK: no focal tenderness or notable swelling SKIN: no apparent skin lesion NEURO: alert and oiented appropriately, no gross deficits   PSYCH: euthymic mood with congruent affect  Diabetic Foot Exam: Inspection: no skin lesion, ulcer or callus. Toenails trimmed  Vascular: DP & PT pulses 2+ bilaterally Neuro: Intact 9-point monofilament exam bilaterally    Assessment and Plan:  1. Type 2 diabetes mellitus without complication, without long-term current use of insulin (HCC): Well-controlled.  A1c 7.1%.  Goal A1c less than 7.5%.  She is tolerating metformin 1000 mg twice daily and exercise daily.  Foot exam within normal limits.  Had an annual eye exam about 3 months ago. -Will continue metformin 1000 mg twice daily. -Continue atorvastatin. -We will check urine for microalbuminuria. -We will discuss about ACE inhibitor/ARB for renal protection at next visit.  Patient is planning to go to [place] in a week.  2. Essential hypertension: Stable.  BP 122/78 today. -Continue amlodipine. -Lifestyle as above. - Basic metabolic panel  3. Screening for colon cancer: continues to resist colonoscopy.  Discussed the benefits and gave her handout.  Encouraged her to discuss this with her daughter who is an Char[employer] fundraiser.  4. Pharmacologic therapy: On metformin 1000 mg twice daily - Vitamin B12   Return in about 6 months (around 03/10/2018) for Diabetes.  Almon Hercules, MD 09/07/17 Pager: [phone]

## 2017-09-08 LAB — VITAMIN B12: Vitamin B-12: 296 pg/mL (ref 232–1245)

## 2017-09-08 LAB — BASIC METABOLIC PANEL
BUN / CREAT RATIO: 22 (ref 12–28)
BUN: 17 mg/dL (ref 8–27)
CHLORIDE: 100 mmol/L (ref 96–106)
CO2: 22 mmol/L (ref 20–29)
Calcium: 10.2 mg/dL (ref 8.7–10.3)
Creatinine, Ser: 0.78 mg/dL (ref 0.57–1.00)
GFR calc Af Amer: 88 mL/min/{1.73_m2} (ref >59)
GFR calc non Af Amer: 77 mL/min/{1.73_m2} (ref >59)
GLUCOSE: 125 mg/dL — AB (ref 65–99)
Potassium: 5.6 mmol/L — ABNORMAL HIGH (ref 3.5–5.2)
SODIUM: 141 mmol/L (ref 134–144)

## 2017-09-11 ENCOUNTER — Other Ambulatory Visit: Payer: Self-pay | Admitting: Student

## 2017-09-11 DIAGNOSIS — E875 Hyperkalemia: Secondary | ICD-10-CM

## 2017-09-11 NOTE — Progress Notes (Signed)
BMP significant for hyperkalemia to 5.6. Not on medication that could cause hyperkalemia. Not sure if the blood is lysed. Attempted to call patient for repeat blood work. No answer. Didn't leave voice mail but sent mychart message. Future order for BMP placed.

## 2018-02-19 ENCOUNTER — Other Ambulatory Visit: Payer: Self-pay

## 2018-02-19 DIAGNOSIS — E119 Type 2 diabetes mellitus without complications: Secondary | ICD-10-CM

## 2018-02-20 NOTE — Telephone Encounter (Signed)
Acknowledged.

## 2018-02-22 ENCOUNTER — Encounter: Payer: Self-pay | Admitting: Family Medicine

## 2018-02-22 ENCOUNTER — Other Ambulatory Visit: Payer: Self-pay

## 2018-02-22 ENCOUNTER — Ambulatory Visit (INDEPENDENT_AMBULATORY_CARE_PROVIDER_SITE_OTHER): Payer: Medicaid Other | Admitting: Family Medicine

## 2018-02-22 VITALS — BP 148/62 | HR 89 | Temp 97.7°F | Ht 64.0 in | Wt 127.0 lb

## 2018-02-22 DIAGNOSIS — Z23 Encounter for immunization: Secondary | ICD-10-CM

## 2018-02-22 DIAGNOSIS — E119 Type 2 diabetes mellitus without complications: Secondary | ICD-10-CM | POA: Diagnosis not present

## 2018-02-22 LAB — POCT GLYCOSYLATED HEMOGLOBIN (HGB A1C): HbA1c, POC (controlled diabetic range): 7.5 % — AB (ref 0.0–7.0)

## 2018-02-22 MED ORDER — METFORMIN HCL 1000 MG PO TABS: 1000.0000 mg | ORAL_TABLET | Freq: Two times a day (BID) | ORAL | 3 refills | Status: DC

## 2018-02-22 NOTE — Patient Instructions (Signed)
Thank you for coming to see me today. It was a pleasure! Today we talked about:   Please continue your metformin and current medications. We will call you with lab results. Please continue walking!  Please follow-up with me in 6 months or sooner as needed.  If you have any questions or concerns, please do not hesitate to call the office at [phone].  Take Care,   [place] Alesia Oshields, DO   Diet Recommendations for Diabetes  Carbohydrate includes starch, sugar, and fiber.  Of these, only sugar and starch raise blood glucose.  (Fiber is found in fruits, vegetables [especially skin, seeds, and stalks] and whole grains.)   Starchy (carb) foods: Bread, rice, pasta, potatoes, corn, cereal, grits, crackers, bagels, muffins, all baked goods.  (Fruit, milk, and yogurt also have carbohydrate, but most of these foods will not spike your blood sugar as most starchy foods will.)  A few fruits do cause high blood sugars; use small portions of bananas (limit to 1/2 at a time), grapes, watermelon, oranges, and most tropical fruits.   Protein foods: Meat, fish, poultry, eggs, dairy foods, and beans such as pinto and kidney beans (beans also provide carbohydrate).   1. Eat at least REAL 3 meals and 1-2 snacks per day. Never go more than 4-5 hours while awake without eating. Eat breakfast within the first hour of getting up.   2. Limit starchy foods to TWO per meal and ONE per snack. ONE portion of a starchy  food is equal to the following:   - ONE slice of bread (or its equivalent, such as half of a hamburger bun).   - 1/2 cup of a "scoopable" starchy food such as potatoes or rice.   - 15 grams of Total Carbohydrate as shown on food label.  3. Include at every meal: a protein food, a carb food, and vegetables and/or fruit.   - Obtain twice the volume of veg's as protein or carbohydrate foods for both lunch and dinner.   - Fresh or frozen veg's are best.   - Keep frozen veg's on hand for a quick vegetable  serving.

## 2018-02-22 NOTE — Progress Notes (Signed)
   Subjective:    Patient ID: Kimberly House, female    DOB: 1945/07/19, 72 y.o.   MRN: [id number]   CC: regular check up, dm  HPI: Diabetes:  Last A1c 7.1 on 09/07/17 Taking medications: metformin 1000 bid On Aspirin, and on statin: She is not on ACE inhibitors or ARBs which was discontinued due to hyperkalemia.  She says she is planning to go to [place] to visit family member soon.  Last eye exam: due dnd scheduled Last foot exam: up to date ROS: denies dizziness, diaphoresis, LOC, polyuria, polydipsia  Health Maintenance: - Flu vaccine - Discussed Advanced directives- patient given them previously but has not discussed as her daughter was not ready, encouraged patient to discuss - refuses mammogram - patient declines colonoscopy, as she never had one, encouraged to have colonoscopy  Smoking status reviewed  ROS: 10 point ROS is otherwise negative, except as mentioned in   Patient Active Problem List   Diagnosis Date Noted  . Positive for macroalbuminuria 09/07/2017  . Hypercalcemia 02/14/2017  . Osteoporosis 02/28/2016  . H/O total vaginal hysterectomy 02/09/2016  . Vitamin D deficiency 02/02/2015  . Hyperlipidemia with target LDL less than 100 01/16/2013  . Diabetes mellitus (HCC) 11/30/2011  . Pharmacologic therapy 08/22/2011  . History of hyperthyroidism 08/22/2011  . Essential hypertension 08/22/2011     Family History  Problem Relation Age of Onset  . Thyroid disease Mother   . Asthma Mother     Past Medical History:  Diagnosis Date  . Hyperthyroidism    s/p thyroidectomy  . Pulmonary TB 1980   Recieved medical treatment X 9 months per pt report; no signs of active TB on CXR    Social Hx: Denies tobacco use, alcohol use, or illicit drug use.  Objective:  BP (!) 148/62   Pulse 89   Temp 97.7 F (36.5 C) (Oral)   Ht 5\' 4"  (1.626 m)   Wt 127 lb (57.6 kg)   SpO2 99%   BMI 21.80 kg/m  Vitals and nursing note reviewed  General: NAD,  pleasant Head: Atraumatic Neck: Supple Cardiac: RRR, normal heart sounds, no murmurs Respiratory: CTAB, normal effort Extremities: no edema or cyanosis. WWP. MSK: normal gait Skin: warm and dry, no rashes noted Neuro: alert and oriented, no focal deficits Psych: Neatly groomed and appropriately dressed. Maintains good eye contact and is cooperative and attentive. Speech is normal volume and rate. Denies SI/ HI. Normal affect.  Assessment & Plan:   Diabetes mellitus (HCC) Well-controlled.  A1c 7.5%.  Goal A1c less than 7.5%.  She is tolerating metformin 1000 mg twice daily and exercise daily.  Had an annual eye exam about 9 months ago, scheduled for February. -Will continue metformin 1000 mg twice daily. -Continue atorvastatin. -Will start ACE inhibitor/ARB for renal protection.   Health maintenance: Flu shot today Patient refusing mammogram and colonoscopy: counseled on benefit vs risk of testing  [place] Puneet Selden, DO Family Medicine Resident, PGY-2

## 2018-02-23 LAB — BASIC METABOLIC PANEL
BUN/Creatinine Ratio: 20 (ref 12–28)
BUN: 16 mg/dL (ref 8–27)
CO2: 20 mmol/L (ref 20–29)
CREATININE: 0.8 mg/dL (ref 0.57–1.00)
Calcium: 10.2 mg/dL (ref 8.7–10.3)
Chloride: 99 mmol/L (ref 96–106)
GFR, EST AFRICAN AMERICAN: 85 mL/min/{1.73_m2} (ref >59)
GFR, EST NON AFRICAN AMERICAN: 74 mL/min/{1.73_m2} (ref >59)
Glucose: 97 mg/dL (ref 65–99)
POTASSIUM: 5.1 mmol/L (ref 3.5–5.2)
SODIUM: 141 mmol/L (ref 134–144)

## 2018-02-24 NOTE — Assessment & Plan Note (Signed)
Well-controlled.  A1c 7.5%.  Goal A1c less than 7.5%.  She is tolerating metformin 1000 mg twice daily and exercise daily.  Had an annual eye exam about 9 months ago, scheduled for February. -Will continue metformin 1000 mg twice daily. -Continue atorvastatin. -Will start ACE inhibitor/ARB for renal protection.

## 2018-02-27 ENCOUNTER — Other Ambulatory Visit: Payer: Self-pay | Admitting: Family Medicine

## 2018-02-27 ENCOUNTER — Telehealth: Payer: Self-pay | Admitting: Family Medicine

## 2018-02-27 MED ORDER — LISINOPRIL 2.5 MG PO TABS: 2.5000 mg | ORAL_TABLET | Freq: Every day | ORAL | 3 refills | Status: DC

## 2018-02-27 NOTE — Telephone Encounter (Signed)
Called and spoke to patient over phone and will start low-dose lisinopril for kidney protection given past microalbuminuria. Patient voiced understanding and answered all appropriate questions.

## 2018-02-28 ENCOUNTER — Other Ambulatory Visit: Payer: Self-pay | Admitting: *Deleted

## 2018-02-28 DIAGNOSIS — M816 Localized osteoporosis [Lequesne]: Secondary | ICD-10-CM

## 2018-02-28 MED ORDER — ALENDRONATE SODIUM 70 MG PO TABS: ORAL_TABLET | ORAL | 3 refills | Status: DC

## 2018-04-08 ENCOUNTER — Telehealth: Payer: Self-pay

## 2018-04-08 ENCOUNTER — Other Ambulatory Visit: Payer: Self-pay | Admitting: Family Medicine

## 2018-04-08 DIAGNOSIS — I10 Essential (primary) hypertension: Secondary | ICD-10-CM

## 2018-04-08 MED ORDER — AMLODIPINE BESYLATE 5 MG PO TABS: 5.0000 mg | ORAL_TABLET | Freq: Every day | ORAL | 3 refills | Status: DC

## 2018-04-08 NOTE — Telephone Encounter (Signed)
Pt called nurse line about her blood pressure medication. Pt stated she was taken off amlodipine due to her kidneys and started on lisinopril ~11/1. Pt states since she has started lisinopril she feels "like she is floating" and her blood pressure readings have been in the high 150s/high 80s. Pt take hers BP meds daily around lunch time 2.5mg . Pt felt much better on amlodipine, but understands why she was taken off. Pt would like for pcp to call her.

## 2018-04-08 NOTE — Telephone Encounter (Signed)
Spoke with patient. See orders only note.

## 2018-04-08 NOTE — Progress Notes (Signed)
Patient called regarding her elevated blood pressures. She was to be her amlodipine 5mg  in addition to lisinopril 2.5mg , but patient had stopped her amlodipine. Have sent in new prescription for amlodipine 5 mg and she will get this today and come in Thursday for a BP check. Nurse visit scheduled. Teach back method used over the phone and all questions answered regarding her medications.   [place] Shawnie Nicole, DO PGY-2, [facility]

## 2018-04-11 ENCOUNTER — Ambulatory Visit: Payer: Medicaid Other | Admitting: *Deleted

## 2018-04-11 ENCOUNTER — Telehealth: Payer: Self-pay | Admitting: *Deleted

## 2018-04-11 DIAGNOSIS — I10 Essential (primary) hypertension: Secondary | ICD-10-CM

## 2018-04-11 NOTE — Telephone Encounter (Signed)
LMOVM for pt to call back and make an appt with Dr. Talbert ForestShirley to followup on BP Cattie Tineo, Maryjo RochesterJessica Dawn, CMA

## 2018-04-11 NOTE — Progress Notes (Signed)
Pt is here for a BP check.. She was confused after her last appointment.  She stopped amlodipine and started lisinopril.  Per orders note Dr. Talbert ForestShirley wanted her to stay on amlodipine and add lisinopril.  Dr. Talbert ForestShirley called and informed pt.  She is here today to follow up on BP readings.  BP today: 120 / 70 Last dose: 9:30 am Symptoms: none  Additional info:   1. On Monday and Tuesday pt states that her daughter told her to take an additional lisinopril because her BP was still high.  So she took 2 lisinopril and one amlodipine those two days.  Advised to only take the 1 pill of each med in the future, unless told differently by Dr. Talbert ForestShirley.  Pt agreeable.  2. She wants to know if she can have a medication to take PRN when her BP is high.  She states that her late [place] had [place] kind of [place]  3.  I made a copy of her at home readings and placed in Dr. Frutoso ChaseShirley's box. Tarahji Ramthun, Maryjo RochesterJessica Dawn, CMA

## 2018-09-09 ENCOUNTER — Other Ambulatory Visit: Payer: Self-pay

## 2018-09-09 DIAGNOSIS — E119 Type 2 diabetes mellitus without complications: Secondary | ICD-10-CM

## 2018-09-09 DIAGNOSIS — E785 Hyperlipidemia, unspecified: Secondary | ICD-10-CM

## 2018-09-09 MED ORDER — ATORVASTATIN CALCIUM 40 MG PO TABS: 40.0000 mg | ORAL_TABLET | Freq: Every day | ORAL | 3 refills | Status: DC

## 2018-12-13 ENCOUNTER — Other Ambulatory Visit: Payer: Self-pay

## 2018-12-13 DIAGNOSIS — I10 Essential (primary) hypertension: Secondary | ICD-10-CM

## 2018-12-13 MED ORDER — AMLODIPINE BESYLATE 5 MG PO TABS: 5.0000 mg | ORAL_TABLET | Freq: Every day | ORAL | 3 refills | Status: DC

## 2018-12-15 ENCOUNTER — Telehealth: Payer: Self-pay | Admitting: Family Medicine

## 2018-12-15 NOTE — Telephone Encounter (Signed)
**  After Hours/ Emergency Line Call**  Received a page to call [phone] - [phone].  Patient: Kimberly House  Caller: Self  Confirmed name & DOB of patient with caller  Subjective:  Patient's emergency is that she is out of refills of her amlodipine.  She ran out of the medication yesterday.    Assessment & Plan  Patient is encouraged to get her refills before the weekend as the providers are not in over the weekend and may or may not check there and boxes for refills.  Additionally, let patient know that this line is for emergencies only. Checked medication list and Dr. Enid Derry refilled this medication on 12/13/18.    Wilber Oliphant, M.D.  PGY-2  [facility] 12/15/2018 9:44 AM

## 2019-02-04 ENCOUNTER — Telehealth: Payer: Self-pay | Admitting: Family Medicine

## 2019-02-04 NOTE — Telephone Encounter (Signed)
Patient just wants to know if she should continue on her current medications?  Please just let her know, at [phone].

## 2019-02-04 NOTE — Telephone Encounter (Signed)
Patient needs an appointment. Virtual or in person to discuss if she should continue. Has not been seen since 2019.

## 2019-02-05 NOTE — Telephone Encounter (Signed)
Patient scheduled for virtual visit on 02-11-2019. Jazmin Hartsell,CMA

## 2019-02-11 ENCOUNTER — Other Ambulatory Visit: Payer: Self-pay

## 2019-02-11 ENCOUNTER — Telehealth (INDEPENDENT_AMBULATORY_CARE_PROVIDER_SITE_OTHER): Payer: Medicare Other | Admitting: Family Medicine

## 2019-02-11 DIAGNOSIS — I10 Essential (primary) hypertension: Secondary | ICD-10-CM | POA: Diagnosis not present

## 2019-02-11 DIAGNOSIS — M810 Age-related osteoporosis without current pathological fracture: Secondary | ICD-10-CM

## 2019-02-11 DIAGNOSIS — E119 Type 2 diabetes mellitus without complications: Secondary | ICD-10-CM | POA: Diagnosis not present

## 2019-02-11 NOTE — Assessment & Plan Note (Signed)
Patient does not have a monitor at home to check her CBG.  However last A1c well controlled at 7.5.  Patient is to continue her Metformin, aspirin, Lipitor and lisinopril.  As stated previously will obtain BMP at later time.  Patient's renal function normal in 02/2018

## 2019-02-11 NOTE — Progress Notes (Signed)
[facility] Telemedicine Visit  Patient consented to have virtual visit. Method of visit: Telephone  Encounter participants: Patient: Kimberly House - located at home Provider: [place] Jaslyne Beeck - located at [facility]  Others (if applicable): n/a  Chief Complaint: Checkup  HPI:  Patient is calling because she wanted to be sure that she would be able to pick up her medications as this is around the time that she would come in for her yearly checkup.  However she does not leave the house due to Covid.  She states that she is staying busy by making masks for her family and friends back home in the [place].  She states that overall she is feeling quite well.  She has no concerns at this time.  She states she would like to avoid coming into the office even to have labs.  She will be getting a flu shot from her local pharmacy.  She states that she does not feel the need to have mammograms or colonoscopies performed anymore as she is old enough that she "does not need to worry about anything".  Counseled her on the risk versus benefit of continued screening.    Patient denies any shortness of breath, chest pain, leg swelling, cough, fevers, feeling down, dizziness, lightheadedness, headaches.  ROS: per HPI  Pertinent PMHx: HTN, T2DM, osteoporosis, h/o hypothyroidism s/p thyroidectomy, HLD  BP 123/75  Exam:  Respiratory: able to speak in complete sentences without issue  ROS: per HPI  Pertinent PMHx: HTN, T2DM, osteoporosis, h/o hypothyroidism s/p thyroidectomy, HLD  BP 123/75  Exam:  Respiratory: able to speak in complete sentences without issue  Assessment/Plan:  Essential hypertension Telephone encounter so unable to obtain labs as patient does not want to come in during the COVID-19 pandemic.  BP at home well controlled at 123/75.  We will continue current medications of Norvasc 5 mg and lisinopril 2.5 mg.  Patient would like to come in in the spring for  screening BMP.  Diabetes mellitus ([facility]) Patient does not have a monitor at home to check her CBG.  However last A1c well controlled at 7.5.  Patient is to continue her Metformin, aspirin, Lipitor and lisinopril.  As stated previously will obtain BMP at later time.  Patient's renal function normal in 02/2018  Osteoporosis Patient reports compliance with alendronate 70 mg once weekly   Patient refusing to come in for labs and flu shot given that she does not leave her home due to COVID-19.  She would like to have her labs done later in the spring if possible.  She is currently overall feeling well and having no issues.  Time spent during visit with patient: 9 minutes  [place] Lira Stephen, DO PGY-3, [facility]

## 2019-02-11 NOTE — Assessment & Plan Note (Signed)
Telephone encounter so unable to obtain labs as patient does not want to come in during the COVID-19 pandemic.  BP at home well controlled at 123/75.  We will continue current medications of Norvasc 5 mg and lisinopril 2.5 mg.  Patient would like to come in in the spring for screening BMP.

## 2019-02-11 NOTE — Assessment & Plan Note (Signed)
Patient reports compliance with alendronate 70 mg once weekly

## 2019-02-17 ENCOUNTER — Other Ambulatory Visit: Payer: Self-pay | Admitting: Family Medicine

## 2019-02-17 DIAGNOSIS — E119 Type 2 diabetes mellitus without complications: Secondary | ICD-10-CM

## 2019-02-18 NOTE — Addendum Note (Signed)
Addended by: Terrye Dombrosky, [place] J on: 02/18/2019 12:04 PM   Modules accepted: Orders

## 2019-03-03 ENCOUNTER — Other Ambulatory Visit: Payer: Self-pay | Admitting: Family Medicine

## 2019-03-04 ENCOUNTER — Other Ambulatory Visit: Payer: Self-pay | Admitting: Family Medicine

## 2019-03-04 MED ORDER — LISINOPRIL 2.5 MG PO TABS: 2.5000 mg | ORAL_TABLET | Freq: Every day | ORAL | 0 refills | Status: DC

## 2019-03-04 NOTE — Telephone Encounter (Signed)
Rx failed.   Resent. Jessica Fleeger, CMA  

## 2019-03-04 NOTE — Addendum Note (Signed)
Addended by: Christen Bame D on: 03/04/2019 03:25 PM   Modules accepted: Orders

## 2019-04-28 ENCOUNTER — Other Ambulatory Visit: Payer: Self-pay | Admitting: Family Medicine

## 2019-05-09 ENCOUNTER — Other Ambulatory Visit: Payer: Self-pay | Admitting: *Deleted

## 2019-05-09 DIAGNOSIS — M816 Localized osteoporosis [Lequesne]: Secondary | ICD-10-CM

## 2019-05-10 MED ORDER — ALENDRONATE SODIUM 70 MG PO TABS: ORAL_TABLET | ORAL | 3 refills | Status: DC

## 2019-05-19 ENCOUNTER — Other Ambulatory Visit: Payer: Self-pay | Admitting: Family Medicine

## 2019-05-19 DIAGNOSIS — E119 Type 2 diabetes mellitus without complications: Secondary | ICD-10-CM

## 2019-07-07 ENCOUNTER — Other Ambulatory Visit: Payer: Self-pay

## 2019-07-07 ENCOUNTER — Encounter: Payer: Self-pay | Admitting: Family Medicine

## 2019-07-07 ENCOUNTER — Ambulatory Visit (INDEPENDENT_AMBULATORY_CARE_PROVIDER_SITE_OTHER): Payer: 59 | Admitting: Family Medicine

## 2019-07-07 VITALS — BP 122/62 | HR 85 | Ht 64.0 in | Wt 127.0 lb

## 2019-07-07 DIAGNOSIS — E785 Hyperlipidemia, unspecified: Secondary | ICD-10-CM

## 2019-07-07 DIAGNOSIS — I10 Essential (primary) hypertension: Secondary | ICD-10-CM | POA: Diagnosis not present

## 2019-07-07 DIAGNOSIS — E119 Type 2 diabetes mellitus without complications: Secondary | ICD-10-CM

## 2019-07-07 LAB — POCT GLYCOSYLATED HEMOGLOBIN (HGB A1C): HbA1c, POC (controlled diabetic range): 8.6 % — AB (ref 0.0–7.0)

## 2019-07-07 MED ORDER — ONETOUCH DELICA LANCETS 33G MISC: 1.0000 | Freq: Every day | 12 refills | Status: DC

## 2019-07-07 MED ORDER — ONETOUCH VERIO W/DEVICE KIT: 1.0000 [IU] | PACK | Freq: Once | 0 refills | Status: AC

## 2019-07-07 MED ORDER — ONETOUCH VERIO VI STRP: ORAL_STRIP | 12 refills | Status: DC

## 2019-07-07 MED ORDER — ONETOUCH DELICA LANCING DEV MISC: 1.0000 | Freq: Once | 0 refills | Status: AC

## 2019-07-07 NOTE — Patient Instructions (Addendum)
Thank you for coming to see me today. It was a pleasure! Today we talked about:   Continue doing your daily walking! We can discuss starting a new medication if needed at the next appointment called Jardiance (empaglifozin).   Please follow-up with me in 3 months or sooner as needed.  If you have any questions or concerns, please do not hesitate to call the office at [phone].  Take Care,   [place] Azalya Galyon, DO   Diet Recommendations for Diabetes  Carbohydrate includes starch, sugar, and fiber.  Of these, only sugar and starch raise blood glucose.  (Fiber is found in fruits, vegetables [especially skin, seeds, and stalks] and whole grains.)   Starchy (carb) foods: Bread, rice, pasta, potatoes, corn, cereal, grits, crackers, bagels, muffins, all baked goods.  (Fruit, milk, and yogurt also have carbohydrate, but most of these foods will not spike your blood sugar as most starchy foods will.)  A few fruits do cause high blood sugars; use small portions of bananas (limit to 1/2 at a time), grapes, watermelon, oranges, and most tropical fruits.   Protein foods: Meat, fish, poultry, eggs, dairy foods, and beans such as pinto and kidney beans (beans also provide carbohydrate).   1. Eat at least REAL 3 meals and 1-2 snacks per day. Never go more than 4-5 hours while awake without eating. Eat breakfast within the first hour of getting up.   2. Limit starchy foods to TWO per meal and ONE per snack. ONE portion of a starchy  food is equal to the following:   - ONE slice of bread (or its equivalent, such as half of a hamburger bun).   - 1/2 cup of a "scoopable" starchy food such as potatoes or rice.   - 15 grams of Total Carbohydrate as shown on food label.  3. Include at every meal: a protein food, a carb food, and vegetables and/or fruit.   - Obtain twice the volume of veg's as protein or carbohydrate foods for both lunch and dinner.   - Fresh or frozen veg's are best.   - Keep frozen veg's on  hand for a quick vegetable serving.

## 2019-07-07 NOTE — Progress Notes (Signed)
   SUBJECTIVE:   CHIEF COMPLAINT / HPI: physical  Health Maintenance: Patient again dos not wish to continue with colon cancer screening or breast cancer screening and voiced understanding of risks of not screening.   Diabetes: Medications: metformin 1000mg  BID Compliance: yes Hypoglycemic symptoms: no On Aspirin, and on statin Last eye exam: recently Last foot exam: performed today ROS: denies dizziness, diaphoresis, LOC, polyuria, polydipsia  Hypertension: - Medications: norvasc 5mg  and lisinopril 2.5mg  - Compliance: yes - Checking BP at home: no - Denies any SOB, CP, vision changes, LE edema, medication SEs, or symptoms of hypotension - Exercise: starting to walk again  PERTINENT  PMH / PSH: T2DM, HLD, HTN, Osteoporosis  OBJECTIVE:  BP 122/62   Pulse 85   Ht 5\' 4"  (1.626 m)   Wt 127 lb (57.6 kg)   SpO2 97%   BMI 21.80 kg/m   General: NAD, pleasant Neck: Supple, no LAD Respiratory: normal work of breathing Neuro: CN II-XII grossly intact Psych: AOx3, appropriate affect  ASSESSMENT/PLAN:   Essential hypertension BP 122/62, continue current medications.   Diabetes mellitus (HCC) A1c increased to 8.6. Patient has started walking again and would like to try lifestyle changes before adding other medications. Counseled on diet changes and encouraged to continue walking.  -Patient would be good candidate for SGLT2 therapy in future and will discuss starting this medication at follow up in ~9months  -given new meter for checking CBG's at home  Hyperlipidemia with target LDL less than 100 LDL at goal at 45. Continue statin therapy.     Akira Adelsberger, DO PGY-3, Family Medicine

## 2019-07-08 LAB — BASIC METABOLIC PANEL
BUN/Creatinine Ratio: 16 (ref 12–28)
BUN: 13 mg/dL (ref 8–27)
CO2: 17 mmol/L — ABNORMAL LOW (ref 20–29)
Calcium: 9.5 mg/dL (ref 8.7–10.3)
Chloride: 107 mmol/L — ABNORMAL HIGH (ref 96–106)
Creatinine, Ser: 0.81 mg/dL (ref 0.57–1.00)
GFR calc Af Amer: 83 mL/min/{1.73_m2} (ref >59)
GFR calc non Af Amer: 72 mL/min/{1.73_m2} (ref >59)
Glucose: 156 mg/dL — ABNORMAL HIGH (ref 65–99)
Potassium: 5.4 mmol/L — ABNORMAL HIGH (ref 3.5–5.2)
Sodium: 145 mmol/L — ABNORMAL HIGH (ref 134–144)

## 2019-07-08 LAB — LIPID PANEL
Chol/HDL Ratio: 2.2 ratio (ref 0.0–4.4)
Cholesterol, Total: 116 mg/dL (ref 100–199)
HDL: 52 mg/dL (ref >39)
LDL Chol Calc (NIH): 45 mg/dL (ref 0–99)
Triglycerides: 102 mg/dL (ref 0–149)
VLDL Cholesterol Cal: 19 mg/dL (ref 5–40)

## 2019-07-10 NOTE — Assessment & Plan Note (Signed)
BP 122/62, continue current medications.

## 2019-07-10 NOTE — Assessment & Plan Note (Signed)
LDL at goal at 45. Continue statin therapy.

## 2019-07-10 NOTE — Assessment & Plan Note (Signed)
A1c increased to 8.6. Patient has started walking again and would like to try lifestyle changes before adding other medications. Counseled on diet changes and encouraged to continue walking.  -Patient would be good candidate for SGLT2 therapy in future and will discuss starting this medication at follow up in ~30months  -given new meter for checking CBG's at home

## 2019-08-11 ENCOUNTER — Encounter: Payer: Self-pay | Admitting: Family Medicine

## 2019-08-11 LAB — HM DIABETES EYE EXAM

## 2019-08-19 ENCOUNTER — Other Ambulatory Visit: Payer: Self-pay | Admitting: Family Medicine

## 2019-08-19 DIAGNOSIS — E119 Type 2 diabetes mellitus without complications: Secondary | ICD-10-CM

## 2019-09-01 ENCOUNTER — Other Ambulatory Visit: Payer: Self-pay

## 2019-09-01 ENCOUNTER — Encounter: Payer: Self-pay | Admitting: Family Medicine

## 2019-09-01 ENCOUNTER — Ambulatory Visit (INDEPENDENT_AMBULATORY_CARE_PROVIDER_SITE_OTHER): Payer: 59 | Admitting: Family Medicine

## 2019-09-01 VITALS — BP 128/84 | HR 100 | Ht 64.0 in | Wt 120.0 lb

## 2019-09-01 DIAGNOSIS — M816 Localized osteoporosis [Lequesne]: Secondary | ICD-10-CM

## 2019-09-01 DIAGNOSIS — I10 Essential (primary) hypertension: Secondary | ICD-10-CM

## 2019-09-01 DIAGNOSIS — M810 Age-related osteoporosis without current pathological fracture: Secondary | ICD-10-CM | POA: Diagnosis not present

## 2019-09-01 DIAGNOSIS — E785 Hyperlipidemia, unspecified: Secondary | ICD-10-CM

## 2019-09-01 DIAGNOSIS — E119 Type 2 diabetes mellitus without complications: Secondary | ICD-10-CM | POA: Diagnosis not present

## 2019-09-01 DIAGNOSIS — R809 Proteinuria, unspecified: Secondary | ICD-10-CM

## 2019-09-01 NOTE — Progress Notes (Signed)
   SUBJECTIVE:   CHIEF COMPLAINT / HPI:   Medication management:  Patient reports that she is traveling to [place] and would like a 90-day supply of her pills with refills because she is not returning until September.  She has previously had issues picking up medications after a resident graduates and wants to avoid this issue.  Hypertension: - Medications: Amlodipine 5 mg, also on lisinopril 2.5 mg for proteinuria - Compliance: Yes - Denies any SOB, CP, vision changes, LE edema, medication SEs, or symptoms of hypotension - Diet: She has been working on her diet and cutting back on the amount of fruit she is eating it when sitting to help with her diabetes - Exercise: Patient continues to exercise regularly  Diabetes: Blood Sugar ranges-117-1 46 Medications: Metformin 1000 mg twice daily Compliance: yes Hypoglycemic symptoms: no On Aspirin, and on statin Last eye exam: recently Last foot exam: up to date ROS: denies dizziness, diaphoresis, LOC, polyuria, polydipsia  Osteoporosis Patient has not had a repeat DEXA scan.  Ordered 02/2019 and encouraged patient to schedule today when she returns from [place].  She remains on Fosamax and calcium with vitamin D.  Fosamax was started in 2017 and patient will need to discontinue soon.  PERTINENT  PMH / PSH: Osteoporosis, T2DM, HTN,  OBJECTIVE:  BP 128/84   Pulse 100   Ht 5\' 4"  (1.626 m)   Wt 120 lb (54.4 kg)   SpO2 100%   BMI 20.60 kg/m   General: NAD, pleasant Neck: Supple Cardiovascular: RRR, no m/r/g, no LE edema Respiratory: CTA BL, normal work of breathing Psych: AOx3, appropriate affect  ASSESSMENT/PLAN:   Diabetes mellitus (HCC) Patient seems to have better control of her sugars as they are reported between 117 - 146 at home fasting in the morning.  She has been working on exercising and eating better.  Again discussed with patient option for starting SGLT2 therapy for renal protection and cardiac protection in  addition to better diabetic control however patient leaving for until September 2021 and would not like to start new medication prior to leaving.  Patient to follow-up with A1c when she returns and will discuss starting empagliflozin with new PCP.  Otherwise, she is to continue Metformin 1000 mg twice daily at this time.  BMP with elevated potassium to 5.7 on 2.5 mg of lisinopril.  We will discontinue lisinopril as she has periodically had elevated potassium after starting this.  Given her history of proteinuria the SGLT2 therapy could help prevent further progression of kidney disease.  Essential hypertension BP at goal today at 128/84.  Continue amlodipine.   Osteoporosis Again encouraged patient to have DEXA scan and given handout for her to call to schedule when she returns from October 2021.  Patient currently on Fosamax, calcium, and vitamin D since 2017.    Provided patient with 90-day supply of medications along with a refill.  2018 Alexah Kivett, DO PGY-3, [place] [facility]

## 2019-09-01 NOTE — Patient Instructions (Signed)
Thank you for coming to see me today. It was a pleasure! Today we talked about:   I will send in a 90-day supply of your medications with refills to be sure that you do not run out while you were in [place].  Please have a wonderful trip!  Please keep up the good work with your diabetes.  I have placed some dietary information below.  We will release your results on my chart and call you if there is anything abnormal.  Please call to have a bone density scan performed.  The order is in so you will only need to call and make an appointment.  It is okay to wait until you come back from [place] to have this done if they are unable to have it done prior to you leaving.  Please follow-up with this office in 3 months or sooner as needed.  If you have any questions or concerns, please do not hesitate to call the office at [phone].  Take Care,   [place] Tamla Winkels, DO   Diet Recommendations for Diabetes  Carbohydrate includes starch, sugar, and fiber.  Of these, only sugar and starch raise blood glucose.  (Fiber is found in fruits, vegetables [especially skin, seeds, and stalks] and whole grains.)   Starchy (carb) foods: Bread, rice, pasta, potatoes, corn, cereal, grits, crackers, bagels, muffins, all baked goods.  (Fruit, milk, and yogurt also have carbohydrate, but most of these foods will not spike your blood sugar as most starchy foods will.)  A few fruits do cause high blood sugars; use small portions of bananas (limit to 1/2 at a time), grapes, watermelon, oranges, and most tropical fruits.   Protein foods: Meat, fish, poultry, eggs, dairy foods, and beans such as pinto and kidney beans (beans also provide carbohydrate).   1. Eat at least REAL 3 meals and 1-2 snacks per day. Never go more than 4-5 hours while awake without eating. Eat breakfast within the first hour of getting up.   2. Limit starchy foods to TWO per meal and ONE per snack. ONE portion of a starchy  food is equal to the  following:   - ONE slice of bread (or its equivalent, such as half of a hamburger bun).   - 1/2 cup of a "scoopable" starchy food such as potatoes or rice.   - 15 grams of Total Carbohydrate as shown on food label.  3. Include at every meal: a protein food, a carb food, and vegetables and/or fruit.   - Obtain twice the volume of veg's as protein or carbohydrate foods for both lunch and dinner.   - Fresh or frozen veg's are best.   - Keep frozen veg's on hand for a quick vegetable serving.

## 2019-09-02 LAB — BASIC METABOLIC PANEL
BUN/Creatinine Ratio: 23 (ref 12–28)
BUN: 23 mg/dL (ref 8–27)
CO2: 20 mmol/L (ref 20–29)
Calcium: 10.3 mg/dL (ref 8.7–10.3)
Chloride: 103 mmol/L (ref 96–106)
Creatinine, Ser: 1.01 mg/dL — ABNORMAL HIGH (ref 0.57–1.00)
GFR calc Af Amer: 64 mL/min/{1.73_m2} (ref >59)
GFR calc non Af Amer: 55 mL/min/{1.73_m2} — ABNORMAL LOW (ref >59)
Glucose: 92 mg/dL (ref 65–99)
Potassium: 5.7 mmol/L — ABNORMAL HIGH (ref 3.5–5.2)
Sodium: 141 mmol/L (ref 134–144)

## 2019-09-02 MED ORDER — ATORVASTATIN CALCIUM 40 MG PO TABS: 40.0000 mg | ORAL_TABLET | Freq: Every day | ORAL | 3 refills | Status: DC

## 2019-09-02 MED ORDER — CALCIUM CARBONATE-VITAMIN D3 600-400 MG-UNIT PO TABS: ORAL_TABLET | ORAL | 3 refills | Status: AC

## 2019-09-02 MED ORDER — LISINOPRIL 2.5 MG PO TABS: 2.5000 mg | ORAL_TABLET | Freq: Every day | ORAL | 3 refills | Status: DC

## 2019-09-02 MED ORDER — METFORMIN HCL 1000 MG PO TABS: ORAL_TABLET | ORAL | 1 refills | Status: DC

## 2019-09-02 MED ORDER — AMLODIPINE BESYLATE 5 MG PO TABS: 5.0000 mg | ORAL_TABLET | Freq: Every day | ORAL | 3 refills | Status: DC

## 2019-09-02 MED ORDER — ALENDRONATE SODIUM 70 MG PO TABS: ORAL_TABLET | ORAL | 3 refills | Status: DC

## 2019-09-02 NOTE — Assessment & Plan Note (Signed)
Again encouraged patient to have DEXA scan and given handout for her to call to schedule when she returns from [place].  Patient currently on Fosamax, calcium, and vitamin D since 2017.

## 2019-09-02 NOTE — Assessment & Plan Note (Addendum)
Patient seems to have better control of her sugars as they are reported between 117 - 146 at home fasting in the morning.  She has been working on exercising and eating better.  Again discussed with patient option for starting SGLT2 therapy for renal protection and cardiac protection in addition to better diabetic control however patient leaving for [place] until September 2021 and would not like to start new medication prior to leaving.  Patient to follow-up with A1c when she returns and will discuss starting empagliflozin with new PCP.  Otherwise, she is to continue Metformin 1000 mg twice daily at this time.  BMP with elevated potassium to 5.7 on 2.5 mg of lisinopril.  We will discontinue lisinopril as she has periodically had elevated potassium after starting this.  Given her history of proteinuria the SGLT2 therapy could help prevent further progression of kidney disease.

## 2019-09-02 NOTE — Assessment & Plan Note (Signed)
BP at goal today at 128/84.  Continue amlodipine.

## 2020-02-27 ENCOUNTER — Ambulatory Visit (INDEPENDENT_AMBULATORY_CARE_PROVIDER_SITE_OTHER): Payer: 59 | Admitting: Family Medicine

## 2020-02-27 ENCOUNTER — Other Ambulatory Visit: Payer: Self-pay

## 2020-02-27 ENCOUNTER — Encounter: Payer: Self-pay | Admitting: Family Medicine

## 2020-02-27 VITALS — BP 128/70 | HR 85 | Ht 64.0 in | Wt 121.0 lb

## 2020-02-27 DIAGNOSIS — Z23 Encounter for immunization: Secondary | ICD-10-CM

## 2020-02-27 DIAGNOSIS — E119 Type 2 diabetes mellitus without complications: Secondary | ICD-10-CM

## 2020-02-27 LAB — POCT GLYCOSYLATED HEMOGLOBIN (HGB A1C): Hemoglobin A1C: 7.3 % — AB (ref 4.0–5.6)

## 2020-02-27 MED ORDER — ONETOUCH VERIO VI STRP: ORAL_STRIP | 12 refills | Status: DC

## 2020-02-27 NOTE — Patient Instructions (Signed)
It was agreed meeting you today!  Today we discussed the following:  Diabetes: Today her HbA1c was 7.3, which is much improved from your prior lab check of 8.6.  Keep up the great work!  If you need any refills on your medications please let me know.  Today we are going to go ahead and get a few more labs on you, if these labs come back abnormal we may decide to change your medications.  I will call you once they resolved and we can discuss if there need to be any changes.

## 2020-02-27 NOTE — Progress Notes (Signed)
    SUBJECTIVE:   CHIEF COMPLAINT / HPI:   Diabetes: Patient has no complaints and no concerns regarding her conditions.  Her home blood sugar measurements have been between 130-150 typically, with occasional measurements over 150.  Patient where she does not need any medication refills at this time.  At last visit it was mentioned that we could consider adding an SGLT2 inhibitor if patient's A1c was still elevated, patient wanted to attempt lifestyle changes instead.  Today patient's A1c is 7.3. -Patient is currently taking lisinopril 2.5 for kidney protection -Patient is on Metformin 1000 mg twice daily   Healthcare maintenance: Patient is interested in getting her flu shot today.  PERTINENT  PMH / PSH: Reviewed  OBJECTIVE:   BP 128/70   Pulse 85   Ht 5\' 4"  (1.626 m)   Wt 121 lb (54.9 kg)   SpO2 98%   BMI 20.77 kg/m   Gen: well-appearing, NAD CV: RRR, no m/r/g appreciated, no peripheral edema Pulm: CTAB, no wheezes/crackles GI: soft, non-tender, non-distended   ASSESSMENT/PLAN:   Diabetes mellitus (HCC) Patient still taking lisinopril 2.5 mg.  HbA1c 7.3 today.  Getting a BMP today to evaluate kidney function.  Patient has had hyperkalemia on recent lab work, if potassium is still elevated may consider complete discontinuing lisinopril and adding on an SGLT2 inhibitor.   Healthcare maintenance:  Patient received flu shot today.  Advance care planning: Counseled patient on use of advanced directives and living [place].  Patient was given an advanced directive packet with full instructions.  She will discuss this with her daughter.  [place], DO [facility]

## 2020-02-27 NOTE — Assessment & Plan Note (Signed)
Patient still taking lisinopril 2.5 mg.  HbA1c 7.3 today.  Getting a BMP today to evaluate kidney function.  Patient has had hyperkalemia on recent lab work, if potassium is still elevated may consider complete discontinuing lisinopril and adding on an SGLT2 inhibitor.

## 2020-02-28 LAB — BASIC METABOLIC PANEL
BUN/Creatinine Ratio: 20 (ref 12–28)
BUN: 16 mg/dL (ref 8–27)
CO2: 24 mmol/L (ref 20–29)
Calcium: 9.9 mg/dL (ref 8.7–10.3)
Chloride: 102 mmol/L (ref 96–106)
Creatinine, Ser: 0.8 mg/dL (ref 0.57–1.00)
GFR calc Af Amer: 84 mL/min/{1.73_m2} (ref >59)
GFR calc non Af Amer: 73 mL/min/{1.73_m2} (ref >59)
Glucose: 112 mg/dL — ABNORMAL HIGH (ref 65–99)
Potassium: 5.6 mmol/L — ABNORMAL HIGH (ref 3.5–5.2)
Sodium: 142 mmol/L (ref 134–144)

## 2020-05-18 ENCOUNTER — Other Ambulatory Visit: Payer: Self-pay

## 2020-05-18 DIAGNOSIS — E119 Type 2 diabetes mellitus without complications: Secondary | ICD-10-CM

## 2020-05-20 ENCOUNTER — Ambulatory Visit (INDEPENDENT_AMBULATORY_CARE_PROVIDER_SITE_OTHER): Payer: 59 | Admitting: Family Medicine

## 2020-05-20 ENCOUNTER — Other Ambulatory Visit: Payer: Self-pay

## 2020-05-20 ENCOUNTER — Encounter: Payer: Self-pay | Admitting: Family Medicine

## 2020-05-20 VITALS — BP 138/74 | HR 88 | Temp 98.7°F | Wt 119.0 lb

## 2020-05-20 DIAGNOSIS — E119 Type 2 diabetes mellitus without complications: Secondary | ICD-10-CM | POA: Diagnosis not present

## 2020-05-20 DIAGNOSIS — I10 Essential (primary) hypertension: Secondary | ICD-10-CM | POA: Diagnosis not present

## 2020-05-20 LAB — POCT GLYCOSYLATED HEMOGLOBIN (HGB A1C): Hemoglobin A1C: 7.3 % — AB (ref 4.0–5.6)

## 2020-05-20 MED ORDER — METFORMIN HCL 1000 MG PO TABS: ORAL_TABLET | ORAL | 3 refills | Status: DC

## 2020-05-20 NOTE — Assessment & Plan Note (Addendum)
HbA1c 7.3.  Her medications include Metformin 1000 g twice daily. -Refill Metformin -Patient can discontinue fasting glucose checks  -We will recheck A1c and lipid panel at next visit in 3 months

## 2020-05-20 NOTE — Assessment & Plan Note (Signed)
Well-controlled on amlodipine 5 mg daily. 

## 2020-05-20 NOTE — Patient Instructions (Signed)
It was so great seeing you today! You are doing great! Your A1c is 7.3 today, which is still really good, you don't have to do the daily blood sugar checks if you do not want to at this point. You also don't have to take the aspirin anymore as there isn't much of a benefit for you.   At your next visit we will get labs to check your cholesterol and see how your diabetes is doing then as well.

## 2020-05-20 NOTE — Progress Notes (Signed)
    SUBJECTIVE:   CHIEF COMPLAINT / HPI:   T2DM: Patient currently on Metformin 1000 mg twice daily as well as atorvastatin 40 mg.  A1c today was 7.3, unchanged from prior.  No current issues with medications, no diabetic nephropathy noted.  Patient's fasting blood sugar checks remain appropriate.  HTN: Blood pressure remains well controlled, with records as below.  Patient currently on amlodipine 5 mg daily.  Previously on lisinopril, but was discontinued due to hyperkalemia.  PERTINENT  PMH / PSH: Hyperlipoidemia, vitamin D deficiency, HTN osteoporosis  OBJECTIVE:   BP 138/74   Pulse 88   Temp 98.7 F (37.1 C) (Oral)   Wt 119 lb (54 kg)   SpO2 98%   BMI 20.43 kg/m   Gen: well-appearing, NAD, sitting up in room, pleasant and conversant CV: RRR, no m/r/g appreciated, no peripheral edema Pulm: CTAB, no wheezes/crackles GI: soft, non-tender, non-distended      ASSESSMENT/PLAN:   Diabetes mellitus (HCC) HbA1c 7.3.  Her medications include Metformin 1000 g twice daily. -Refill Metformin -Patient can discontinue fasting glucose checks  -We will recheck A1c and lipid panel at next visit in 3 months  Essential hypertension Well-controlled on amlodipine 5 mg daily.   Daily ASA use Patient was advised that she did not need to take aspirin daily anymore, she has no history of coronary artery disease or coronary events.  Daily ASA states an increased bleeding risk and is of no benefit for this patient at this time.  Evelena Leyden, DO [facility]

## 2020-05-20 NOTE — Telephone Encounter (Signed)
Second request for this refill.Kimberly House, CMA

## 2020-08-20 LAB — HM DIABETES EYE EXAM

## 2020-08-26 NOTE — Patient Instructions (Signed)
It was so great seeing you today! Today we are checking your kidney function and cholesterol level. Your HbA1c today was 7.6, which is still within your goal range of under 8. Your blood pressure was elevated today, but we will not make any changes at this time.   We are checking your cholesterol, kidney function, and urine today and will call you with the results.

## 2020-08-26 NOTE — Progress Notes (Signed)
    SUBJECTIVE:   CHIEF COMPLAINT / HPI:   Type 2 DM Patient currently on Metformin without issues. Had ophthamology exam this year. Diabetic foot exam done, ophthamology exam done this year, awaiting records.  HTN Patient currently on Amlodipine 5mg  daily. Blood pressures at home have been 130/80s, not over 150s systolic. Denies HA, blurred vision associated with elevated pressures  Healthcare maintenance  Patient declines mammogram and colonoscopy.  PERTINENT  PMH / PSH: Reviewed  OBJECTIVE:   BP (!) 158/84   Pulse 88   Ht 5\' 4"  (1.626 m)   Wt 118 lb 3.2 oz (53.6 kg)   SpO2 98%   BMI 20.29 kg/m   Gen: well-appearing, NAD CV: RRR, no m/r/g appreciated, no peripheral edema Pulm: CTAB, no wheezes/crackles  ASSESSMENT/PLAN:   Type 2 DM HbA1c today 7.6, which is within goal range of 8, currently stable on Metformin. Patient overdue for microalbumin, foot exam. Ophthalmology exam completed this year, will get the records. - Continue metformin 1000mg  BID - Urine microalbumin - HbA1c in 6 months - BMP today  HTN  BP today was 158/84. Currently on Amlodipine, had hyperkalemia at last visit, likely secondary to lisinopril use. Will check BMP today to ensure resolution of hyperkalemia. - Checking BMP to monitor kidney function and improvement in prior hyperkalemia - Continue amlodipine 5mg  - If persistently, elevated consider increasing to 10mg  at next visit.  Hyperlipidemia - Checking lipid panel today  Healthcare maintenance Patient declines mammogram and colonoscopy, discussed risks and benefits regarding testing.    , DO [facility]

## 2020-08-27 ENCOUNTER — Encounter: Payer: Self-pay | Admitting: Family Medicine

## 2020-08-27 ENCOUNTER — Other Ambulatory Visit: Payer: Self-pay

## 2020-08-27 ENCOUNTER — Ambulatory Visit (INDEPENDENT_AMBULATORY_CARE_PROVIDER_SITE_OTHER): Payer: 59 | Admitting: Family Medicine

## 2020-08-27 VITALS — BP 158/84 | HR 88 | Ht 64.0 in | Wt 118.2 lb

## 2020-08-27 DIAGNOSIS — I10 Essential (primary) hypertension: Secondary | ICD-10-CM | POA: Diagnosis not present

## 2020-08-27 DIAGNOSIS — E119 Type 2 diabetes mellitus without complications: Secondary | ICD-10-CM

## 2020-08-27 DIAGNOSIS — E785 Hyperlipidemia, unspecified: Secondary | ICD-10-CM

## 2020-08-27 LAB — POCT GLYCOSYLATED HEMOGLOBIN (HGB A1C): HbA1c, POC (controlled diabetic range): 7.6 % — AB (ref 0.0–7.0)

## 2020-08-28 LAB — BASIC METABOLIC PANEL
BUN/Creatinine Ratio: 21 (ref 12–28)
BUN: 16 mg/dL (ref 8–27)
CO2: 18 mmol/L — ABNORMAL LOW (ref 20–29)
Calcium: 9.9 mg/dL (ref 8.7–10.3)
Chloride: 102 mmol/L (ref 96–106)
Creatinine, Ser: 0.76 mg/dL (ref 0.57–1.00)
Glucose: 133 mg/dL — ABNORMAL HIGH (ref 65–99)
Potassium: 5.3 mmol/L — ABNORMAL HIGH (ref 3.5–5.2)
Sodium: 143 mmol/L (ref 134–144)
eGFR: 82 mL/min/{1.73_m2} (ref >59)

## 2020-08-28 LAB — MICROALBUMIN / CREATININE URINE RATIO
Creatinine, Urine: 51.4 mg/dL
Microalb/Creat Ratio: 357 mg/g creat — ABNORMAL HIGH (ref 0–29)
Microalbumin, Urine: 183.6 ug/mL

## 2020-08-28 LAB — LIPID PANEL
Chol/HDL Ratio: 2.1 ratio (ref 0.0–4.4)
Cholesterol, Total: 126 mg/dL (ref 100–199)
HDL: 60 mg/dL (ref >39)
LDL Chol Calc (NIH): 52 mg/dL (ref 0–99)
Triglycerides: 67 mg/dL (ref 0–149)
VLDL Cholesterol Cal: 14 mg/dL (ref 5–40)

## 2020-09-01 NOTE — Progress Notes (Signed)
Called patient with results. Patient is not currently taking any potassium supplementation and is not using salt substitute (which can have elevated KCl) or NSAIDs. Patient previously stopped Lisinopril several months ago. Potassium is now mildly elevated, will continue to monitor and consider a thiazide if persistently elevated.  Microalbumin:Cr ratio is elevated, BP at home overall well controlled, will continue to monitor and optimize BP medications as appropriate.    Kimberly Bukowski, DO

## 2020-09-08 ENCOUNTER — Other Ambulatory Visit: Payer: Self-pay

## 2020-09-08 DIAGNOSIS — E119 Type 2 diabetes mellitus without complications: Secondary | ICD-10-CM

## 2020-09-08 DIAGNOSIS — E785 Hyperlipidemia, unspecified: Secondary | ICD-10-CM

## 2020-09-10 NOTE — Telephone Encounter (Signed)
Patient calls nurse line checking the status of medication refill.  

## 2020-09-12 MED ORDER — ATORVASTATIN CALCIUM 40 MG PO TABS: 40.0000 mg | ORAL_TABLET | Freq: Every day | ORAL | 3 refills | Status: DC

## 2020-10-19 ENCOUNTER — Other Ambulatory Visit: Payer: Self-pay

## 2020-10-19 DIAGNOSIS — E119 Type 2 diabetes mellitus without complications: Secondary | ICD-10-CM

## 2020-10-19 DIAGNOSIS — M816 Localized osteoporosis [Lequesne]: Secondary | ICD-10-CM

## 2020-10-19 DIAGNOSIS — I10 Essential (primary) hypertension: Secondary | ICD-10-CM

## 2020-10-19 NOTE — Telephone Encounter (Signed)
Patient calls nurse line reporting she is going out of the country through August. Patient reports she needs a 2 month supply of Alendronate, Amlodipine, and Metformin. Please advise.

## 2020-10-20 ENCOUNTER — Other Ambulatory Visit: Payer: Self-pay

## 2020-10-20 MED ORDER — AMLODIPINE BESYLATE 5 MG PO TABS: 5.0000 mg | ORAL_TABLET | Freq: Every day | ORAL | 3 refills | Status: DC

## 2020-10-20 MED ORDER — METFORMIN HCL 1000 MG PO TABS
ORAL_TABLET | ORAL | 3 refills | Status: DC
Start: 2020-10-20 — End: 2021-04-19

## 2020-10-20 MED ORDER — ALENDRONATE SODIUM 70 MG PO TABS
ORAL_TABLET | ORAL | 3 refills | Status: DC
Start: 2020-10-20 — End: 2021-09-20

## 2020-11-30 ENCOUNTER — Telehealth: Payer: Self-pay

## 2020-11-30 NOTE — Telephone Encounter (Signed)
LVM to have pt call back to schedule AWV.   RE: confirm insurance and schedule AWV on my schedule if times are convenient for patient or other AWV schedule as template permits.   

## 2020-12-28 NOTE — Telephone Encounter (Signed)
LVM to have pt call back to schedule AWV.   RE: confirm insurance and schedule AWV on my schedule if times are convenient for patient or other AWV schedule as template permits.   Closing note 

## 2021-03-09 ENCOUNTER — Other Ambulatory Visit: Payer: Self-pay

## 2021-03-09 ENCOUNTER — Encounter: Payer: Self-pay | Admitting: Family Medicine

## 2021-03-09 ENCOUNTER — Ambulatory Visit (INDEPENDENT_AMBULATORY_CARE_PROVIDER_SITE_OTHER): Payer: 59 | Admitting: Family Medicine

## 2021-03-09 VITALS — BP 144/70 | HR 87 | Ht 64.0 in | Wt 117.0 lb

## 2021-03-09 DIAGNOSIS — Z23 Encounter for immunization: Secondary | ICD-10-CM

## 2021-03-09 DIAGNOSIS — I10 Essential (primary) hypertension: Secondary | ICD-10-CM

## 2021-03-09 DIAGNOSIS — E119 Type 2 diabetes mellitus without complications: Secondary | ICD-10-CM

## 2021-03-09 DIAGNOSIS — Z0000 Encounter for general adult medical examination without abnormal findings: Secondary | ICD-10-CM

## 2021-03-09 DIAGNOSIS — E785 Hyperlipidemia, unspecified: Secondary | ICD-10-CM

## 2021-03-09 DIAGNOSIS — M810 Age-related osteoporosis without current pathological fracture: Secondary | ICD-10-CM

## 2021-03-09 LAB — POCT GLYCOSYLATED HEMOGLOBIN (HGB A1C): HbA1c, POC (controlled diabetic range): 7.7 % — AB (ref 0.0–7.0)

## 2021-03-09 NOTE — Assessment & Plan Note (Signed)
Last cholesterol panel collected in May with LDL of 52, within goal.  Do not feel the need to check today, will repeat at the 1 year mark from last lab

## 2021-03-09 NOTE — Assessment & Plan Note (Signed)
Currently on alendronate, stable and compliant.

## 2021-03-09 NOTE — Assessment & Plan Note (Signed)
HbA1c 7.7 today.  Medications include metformin 1000 mg.  Patient's goal is around 7.5-8, which has been achieved.  Consideration to initiate an SGLT2 given patient's microalbuminuria, but did not want to initiate at this visit, will reconsider at next visit per patient preference. - Continue current regimen - F/u in 6 months for A1c and possible microalbumin

## 2021-03-09 NOTE — Progress Notes (Signed)
Subjective:  CC -- Annual Physical; With no complaints.  Patient reports that she is overall doing well.  She is living with her daughter and things are going very well.  They did discuss about advance directives and living wills after the last visit but she says that her daughter did not want to discuss it at that time.  She reports she is in good health and does not mind getting caught up on her vaccines but does not want them all done today.  She does note that on October 7 she did have an episode where she tripped and fell down on the sidewalk because it was uneven and she was watching the children across the street instead of where she was walking.  She fell onto her hands and knees but has had no issues since.   General Healthcare: Medication Compliance: yes  Aspirin: no, does not need at this time (was primary prevention). Dx Hypertension: yes  Dx Hyperlipidemia: yes  Diabetes: yes, A1c being done today (last one in May 7.6) Dx Obesity: no, BMI 20.08 Weight Loss: No Physical Activity: yes, walks at least 150 minutes per week  Urinary Incontinence: no    Social: Driving: no, driven by her grandchildren  Alcohol Use: no  Tobacco Use: never Other Drugs: no  Independence: living with daughter's family, which is going ver well Depression: no   - PHQ9 score: 0 Support and Life at Home: yes, no  Advanced Directives: no, daughter is hesitant to discuss this  Cancer:  Colorectal >> Colonoscopy: no, patient declines  Lung >> Tobacco Use: none Breast >> Mammogram: previously done but patient doesn't want anymore  Cervical/Endometrial >>  - Postmenopausal: no - Hysterectomy: yes  - Vaginal Bleeding: no - Pap Smear: Previously done, patient reports no issues ([place] in the [place]  Skin >> Suspicious lesions: no   Other: Osteoporosis: yes currently on alendronate Zoster Vaccine: no, willing to do but does not want today Flu Vaccine: yes, received today  Pneumonia  Vaccine: no, would like but not today (she will call when she wants it)     Past Medical History Patient Active Problem List   Diagnosis Date Noted   Positive for macroalbuminuria 09/07/2017   Osteoporosis 02/28/2016   H/O total vaginal hysterectomy 02/09/2016   Vitamin D deficiency 02/02/2015   Hyperlipidemia with target LDL less than 100 01/16/2013   Diabetes mellitus (HCC) 11/30/2011   History of hyperthyroidism 08/22/2011   Essential hypertension 08/22/2011    Medications- reviewed and updated Current Outpatient Medications  Medication Sig Dispense Refill   alendronate (FOSAMAX) 70 MG tablet TAKE ONE TABLET BY MOUTH ONCE A WEEK IN THE MORNING WITH A FULL GLASS OF WATER, 30 MINUTES BEFORE A MEAL OR BEVERAGE. REMAIN UPRIGHT 12 tablet 3   amLODipine (NORVASC) 5 MG tablet Take 1 tablet (5 mg total) by mouth daily. 90 tablet 3   atorvastatin (LIPITOR) 40 MG tablet Take 1 tablet (40 mg total) by mouth daily. 90 tablet 3   Calcium Carbonate-Vitamin D3 (CALCIUM 600/VITAMIN D) 600-400 MG-UNIT TABS Take 2 tablets by mouth daily with meal. 180 tablet 3   glucose blood (ONETOUCH VERIO) test strip Use as instructed 100 each 12   metFORMIN (GLUCOPHAGE) 1000 MG tablet TAKE 1 TABLET BY MOUTH TWICE DAILY WITH A MEAL 180 tablet 3   OneTouch Delica Lancets 33G MISC 1 applicator by Does not apply route daily before breakfast. 100 each 12   No current facility-administered medications for this visit.  Objective: BP (!) 144/70   Pulse 87   Ht 5\' 4"  (1.626 m)   Wt 117 lb (53.1 kg)   BMI 20.08 kg/m  Gen: NAD, alert, cooperative with exam HEENT: NCAT, EOMI General: NAD, well-appearing, well-nourished Respiratory: No respiratory distress, breathing comfortably, able to speak in full sentences Skin: warm and dry, no rashes noted on exposed skin Psych: Appropriate affect and mood Genital Exam: not done Ext: No edema, warm Neuro: Alert and oriented, No gross  deficits   Assessment/Plan:  Osteoporosis Currently on alendronate, stable and compliant.  Hyperlipidemia with target LDL less than 100 Last cholesterol panel collected in May with LDL of 52, within goal.  Do not feel the need to check today, will repeat at the 1 year mark from last lab  Diabetes mellitus (HCC) HbA1c 7.7 today.  Medications include metformin 1000 mg.  Patient's goal is around 7.5-8, which has been achieved.  Consideration to initiate an SGLT2 given patient's microalbuminuria, but did not want to initiate at this visit, will reconsider at next visit per patient preference. - Continue current regimen - F/u in 6 months for A1c and possible microalbumin  Essential hypertension BP in office today 144/70, patient has not taken her amlodipine yet this morning.  We will repeat a BMP today to monitor her hyperkalemia.  Consideration for an ACE/ARB, but hesitant if still persistently hyperkalemic. -BMP today -Follow-up in 6 months -Continue amlodipine   Orders Placed This Encounter  Procedures   Flu Vaccine QUAD High Dose(Fluad)   Basic Metabolic Panel   HgB A1c       Darrow Barreiro, DO, PGY-2 03/09/2021 9:27 AM

## 2021-03-09 NOTE — Patient Instructions (Signed)
Everything looks good today, your A1c was 7.7 (your goal is around 7.5-8). I am not changing your medications at this time. We are going to get a lab to check on your electrolytes and kidneys.  If your daughter would like to talk about advanced directives and living wills then please have her call the office and I can reach out to her.

## 2021-03-09 NOTE — Assessment & Plan Note (Addendum)
BP in office today 144/70, patient has not taken her amlodipine yet this morning.  We will repeat a BMP today to monitor her hyperkalemia.  Consideration for an ACE/ARB, but hesitant if still persistently hyperkalemic. -BMP today -Follow-up in 6 months -Continue amlodipine

## 2021-03-10 LAB — BASIC METABOLIC PANEL
BUN/Creatinine Ratio: 22 (ref 12–28)
BUN: 18 mg/dL (ref 8–27)
CO2: 27 mmol/L (ref 20–29)
Calcium: 10.1 mg/dL (ref 8.7–10.3)
Chloride: 102 mmol/L (ref 96–106)
Creatinine, Ser: 0.83 mg/dL (ref 0.57–1.00)
Glucose: 142 mg/dL — ABNORMAL HIGH (ref 70–99)
Potassium: 5.7 mmol/L — ABNORMAL HIGH (ref 3.5–5.2)
Sodium: 146 mmol/L — ABNORMAL HIGH (ref 134–144)
eGFR: 73 mL/min/{1.73_m2} (ref >59)

## 2021-03-15 ENCOUNTER — Telehealth: Payer: Self-pay | Admitting: Family Medicine

## 2021-03-15 DIAGNOSIS — E875 Hyperkalemia: Secondary | ICD-10-CM

## 2021-03-15 MED ORDER — LOKELMA 5 G PO PACK: 10.0000 g | PACK | Freq: Every day | ORAL | 0 refills | Status: DC

## 2021-03-15 NOTE — Telephone Encounter (Signed)
Call patient regarding results of BMP.  Patient has persistent hyperkalemia despite having previously been taken off medications that may increase it such as lisinopril.  Patient is not having any issues and has no concerns at this time.  Patient was also found to be mildly hypernatremic.  Discussed case with Dr. Lum Babe and Dr. McDiarmid, at this time we will initiate low-dose Lokelma x3 days and have patient follow-up for a repeat BMP after the holiday and keep a close eye on this.  Discussed with patient to also increase her water intake over the next couple of days to make sure her sodium decreases, likely related to fluid status   Kimberly [place], DO

## 2021-03-16 ENCOUNTER — Telehealth: Payer: Self-pay

## 2021-03-16 NOTE — Telephone Encounter (Signed)
Received phone call from pharmacist regarding rx for [facility] packets. Pharmacist reports that they only carry 10 gram packets of medication.  If appropriate, please update rx to 10 gram packets.   Veronda Prude, RN

## 2021-03-21 ENCOUNTER — Other Ambulatory Visit: Payer: 59

## 2021-03-23 NOTE — Telephone Encounter (Signed)
Patient calls nurse line to check status of new rx being sent to pharmacy.   Please advise.   Veronda Prude, RN

## 2021-03-24 MED ORDER — LOKELMA 10 G PO PACK: 10.0000 g | PACK | Freq: Every day | ORAL | 0 refills | Status: AC

## 2021-03-28 ENCOUNTER — Other Ambulatory Visit: Payer: Self-pay

## 2021-03-28 ENCOUNTER — Other Ambulatory Visit: Payer: 59

## 2021-03-28 DIAGNOSIS — E875 Hyperkalemia: Secondary | ICD-10-CM

## 2021-03-29 LAB — BASIC METABOLIC PANEL
BUN/Creatinine Ratio: 21 (ref 12–28)
BUN: 18 mg/dL (ref 8–27)
CO2: 24 mmol/L (ref 20–29)
Calcium: 9.9 mg/dL (ref 8.7–10.3)
Chloride: 100 mmol/L (ref 96–106)
Creatinine, Ser: 0.85 mg/dL (ref 0.57–1.00)
Glucose: 329 mg/dL — ABNORMAL HIGH (ref 70–99)
Potassium: 4.8 mmol/L (ref 3.5–5.2)
Sodium: 140 mmol/L (ref 134–144)
eGFR: 71 mL/min/{1.73_m2} (ref >59)

## 2021-04-16 ENCOUNTER — Other Ambulatory Visit: Payer: Self-pay | Admitting: Family Medicine

## 2021-04-16 DIAGNOSIS — E119 Type 2 diabetes mellitus without complications: Secondary | ICD-10-CM

## 2021-08-29 ENCOUNTER — Encounter: Payer: Self-pay | Admitting: Family Medicine

## 2021-08-29 ENCOUNTER — Ambulatory Visit (INDEPENDENT_AMBULATORY_CARE_PROVIDER_SITE_OTHER): Payer: 59 | Admitting: Family Medicine

## 2021-08-29 VITALS — BP 131/98 | HR 63 | Ht 64.0 in | Wt 120.4 lb

## 2021-08-29 DIAGNOSIS — M810 Age-related osteoporosis without current pathological fracture: Secondary | ICD-10-CM | POA: Diagnosis not present

## 2021-08-29 DIAGNOSIS — I10 Essential (primary) hypertension: Secondary | ICD-10-CM

## 2021-08-29 DIAGNOSIS — E785 Hyperlipidemia, unspecified: Secondary | ICD-10-CM | POA: Diagnosis not present

## 2021-08-29 DIAGNOSIS — R000 Tachycardia, unspecified: Secondary | ICD-10-CM

## 2021-08-29 DIAGNOSIS — E119 Type 2 diabetes mellitus without complications: Secondary | ICD-10-CM | POA: Diagnosis not present

## 2021-08-29 LAB — POCT GLYCOSYLATED HEMOGLOBIN (HGB A1C): HbA1c, POC (controlled diabetic range): 8 % — AB (ref 0.0–7.0)

## 2021-08-29 MED ORDER — CARVEDILOL 3.125 MG PO TABS: 3.1250 mg | ORAL_TABLET | Freq: Two times a day (BID) | ORAL | 3 refills | Status: DC

## 2021-08-29 NOTE — Assessment & Plan Note (Addendum)
Abnormal pulse noted on physical exam, patient feeling fluttering sensation in her chest.  EKG with P waves noted most prominently in lead I, so sinus tachycardia.  Unclear whether there are missed beats or PACs present, given patient is somewhat symptomatic with tachycardia, will initiate a low-dose beta-blocker.  Reassuringly this is a sinus arrhythmia but does not appear to be atrial fibrillation at this time. ?- Start carvedilol 3.125 mg twice daily ?- TSH and CBC to evaluate for other causes of tachycardia ?- If persistent or symptomatic further, consider echo  ?- Follow-up in 2 weeks for reevaluation ?- Obtain EKG at next visit ?- Strict return and ED precautions given ?

## 2021-08-29 NOTE — Progress Notes (Signed)
? ? ?  SUBJECTIVE:  ? ?CHIEF COMPLAINT / HPI:  ? ?T2DM ?- Checking BG at home: fasting usually around 130s-150s, atorvastatin 40mg   ?- Medications: Metformin 1000mg  twice daily ?- Compliance: taking well ?- Diet: Had family in town and was eating a lot of sweets in the last 3 months ?- Exercise: has not been walking due to weather ?- Eye exam: Last week (has cataracts) ?- foot exam:  08/27/2020 ?- microalbumin: 183 in 2022   ?- denies symptoms of hypoglycemia, polyuria, polydipsia, numbness extremities, foot ulcers/trauma ? ?HTN: ?- Medications: Amlodipine 5 mg daily ?- Compliance: Did not take amlodipine this morning ?- Checking BP at home: 130s/70s on average ?- Denies any SOB, CP, vision changes, LE edema, medication SEs, or symptoms of hypotension ? ? ?Going to 10[place]27[place]2020 from June to august, needs refills for 3 months. ? ?PERTINENT  PMH / PSH: Reviewed ? ?OBJECTIVE:  ? ?BP (!) 131/98   Pulse 63   Ht 5\' 4"  (1.626 m)   Wt 120 lb 6.4 oz (54.6 kg)   SpO2 98%   BMI 20.67 kg/m?   ?Gen: well-appearing, NAD ?CV: irregular rate, no m/r/g appreciated, no peripheral edema ?Pulm: CTAB, no wheezes/crackles ?GI: soft, non-tender, non-distended ? ? ? ? ? ?ASSESSMENT/PLAN:  ? ?Sinus tachycardia ?Abnormal pulse noted on physical exam, patient feeling fluttering sensation in her chest.  EKG with P waves noted most prominently in lead I, so sinus tachycardia.  Unclear whether there are missed beats or PACs present, given patient is somewhat symptomatic with tachycardia, will initiate a low-dose beta-blocker.  Reassuringly this is a sinus arrhythmia but does not appear to be atrial fibrillation at this time. ?- Start carvedilol 3.125 mg twice daily ?- TSH and CBC to evaluate for other causes of tachycardia ?- If persistent or symptomatic further, consider echo  ?- Follow-up in 2 weeks for reevaluation ?- Obtain EKG at next visit ?- Strict return and ED precautions given ? ?Diabetes mellitus (HCC) ?A1c slightly increased today  to 8.  Patient reports poor diet compliance most recently.  Is stable on metformin 1000 mg twice daily.  Will allow patient to attempt diet modification.  Do consider starting an SGLT2 if significant microalbuminemia or no improvement at next visit.  We will hold off on initiating at this time as the patient will be out of the state from June to August and we would need close lab follow-up. ?- Continue metformin ?- Consider SGLT2 ?- BMP today ?- Urine creatinine albumin ratio today ? ?Essential hypertension ?BP mildly elevated at 131/98.  Reports home measurements of 130s/70s.  Stable on amlodipine 5 mg daily.  Previous ACE inhibitor's discontinued due to persistent hyperkalemia. ?- Continue amlodipine ?- Close monitor at home ?- Starting carvedilol 3.125 mg twice daily ? ?Osteoporosis ?Ordered today, discussed with patient to obtain this before she leaves.  We will need to consider an alendronate holiday. ?  ? ? ?Luiz Trumpower, DO ?[facility]  ?

## 2021-08-29 NOTE — Assessment & Plan Note (Signed)
Ordered today, discussed with patient to obtain this before she leaves.  We will need to consider an alendronate holiday. ?

## 2021-08-29 NOTE — Assessment & Plan Note (Signed)
A1c slightly increased today to 8.  Patient reports poor diet compliance most recently.  Is stable on metformin 1000 mg twice daily.  Will allow patient to attempt diet modification.  Do consider starting an SGLT2 if significant microalbuminemia or no improvement at next visit.  We will hold off on initiating at this time as the patient will be out of the state from June to August and we would need close lab follow-up. ?- Continue metformin ?- Consider SGLT2 ?- BMP today ?- Urine creatinine albumin ratio today ?

## 2021-08-29 NOTE — Assessment & Plan Note (Signed)
BP mildly elevated at 131/98.  Reports home measurements of 130s/70s.  Stable on amlodipine 5 mg daily.  Previous ACE inhibitor's discontinued due to persistent hyperkalemia. ?- Continue amlodipine ?- Close monitor at home ?- Starting carvedilol 3.125 mg twice daily ?

## 2021-08-29 NOTE — Patient Instructions (Addendum)
Your A1c is still going up a little bit, it is now 8.  We are going to check your urine to see how your kidney function is doing with that as well. ? ?We are also going to get labs to check your kidneys and electrolytes. ? ?We need to get another scan of your bones to consider holding one of your medications.  Please make sure to call the breast center and get your DEXA scan scheduled. ? ?I am going to start you on a new medication called carvedilol, it is a medication you will take twice a day and I am hoping it would help your heart rate a little bit.  We can get some labs as well to see if there is a reason that could be causing your heart rate. ?

## 2021-08-30 ENCOUNTER — Encounter: Payer: Self-pay | Admitting: Family Medicine

## 2021-08-30 LAB — CBC
Hematocrit: 49.9 % — ABNORMAL HIGH (ref 34.0–46.6)
Hemoglobin: 16.9 g/dL — ABNORMAL HIGH (ref 11.1–15.9)
MCH: 29.3 pg (ref 26.6–33.0)
MCHC: 33.9 g/dL (ref 31.5–35.7)
MCV: 87 fL (ref 79–97)
Platelets: 589 10*3/uL — ABNORMAL HIGH (ref 150–450)
RBC: 5.77 x10E6/uL — ABNORMAL HIGH (ref 3.77–5.28)
RDW: 12.2 % (ref 11.7–15.4)
WBC: 10.6 10*3/uL (ref 3.4–10.8)

## 2021-08-30 LAB — BASIC METABOLIC PANEL
BUN/Creatinine Ratio: 29 — ABNORMAL HIGH (ref 12–28)
BUN: 22 mg/dL (ref 8–27)
CO2: 22 mmol/L (ref 20–29)
Calcium: 10.3 mg/dL (ref 8.7–10.3)
Chloride: 101 mmol/L (ref 96–106)
Creatinine, Ser: 0.75 mg/dL (ref 0.57–1.00)
Glucose: 130 mg/dL — ABNORMAL HIGH (ref 70–99)
Potassium: 4.7 mmol/L (ref 3.5–5.2)
Sodium: 140 mmol/L (ref 134–144)
eGFR: 83 mL/min/{1.73_m2} (ref >59)

## 2021-08-30 LAB — TSH: TSH: 5.25 u[IU]/mL — ABNORMAL HIGH (ref 0.450–4.500)

## 2021-08-30 LAB — MICROALBUMIN / CREATININE URINE RATIO
Creatinine, Urine: 55.5 mg/dL
Microalb/Creat Ratio: 782 mg/g creat — ABNORMAL HIGH (ref 0–29)
Microalbumin, Urine: 434.2 ug/mL

## 2021-08-30 LAB — HM DIABETES EYE EXAM

## 2021-09-12 ENCOUNTER — Other Ambulatory Visit: Payer: Self-pay

## 2021-09-12 DIAGNOSIS — E119 Type 2 diabetes mellitus without complications: Secondary | ICD-10-CM

## 2021-09-12 DIAGNOSIS — E785 Hyperlipidemia, unspecified: Secondary | ICD-10-CM

## 2021-09-12 MED ORDER — ATORVASTATIN CALCIUM 40 MG PO TABS: 40.0000 mg | ORAL_TABLET | Freq: Every day | ORAL | 3 refills | Status: DC

## 2021-09-15 ENCOUNTER — Telehealth: Payer: Self-pay

## 2021-09-15 NOTE — Telephone Encounter (Signed)
Patient calls nurse line reporting nausea and dizziness since last night.   Patient reports she feels symptoms are related to Carvedilol. Patient reports medication was started on 5/9, however last night was the first night she experienced these symptoms.  Patient denies vomiting, headaches, vision changes, SOB, weakness or chest pains at this time.   Patients reports BP this morning was 127/73 HR 86.  Patient reports she did wake up feeling better and has been staying hydrated.   Patient scheduled for tomorrow for evaluation.  Red flags discussed.

## 2021-09-16 ENCOUNTER — Encounter: Payer: Self-pay | Admitting: Student

## 2021-09-16 ENCOUNTER — Ambulatory Visit (INDEPENDENT_AMBULATORY_CARE_PROVIDER_SITE_OTHER): Payer: 59 | Admitting: Student

## 2021-09-16 DIAGNOSIS — R000 Tachycardia, unspecified: Secondary | ICD-10-CM

## 2021-09-16 DIAGNOSIS — R42 Dizziness and giddiness: Secondary | ICD-10-CM | POA: Insufficient documentation

## 2021-09-16 DIAGNOSIS — H8110 Benign paroxysmal vertigo, unspecified ear: Secondary | ICD-10-CM

## 2021-09-16 NOTE — Assessment & Plan Note (Signed)
Patient presents with symptoms of dizziness no vomiting worse when changing head direction, no weakness or FND, concerning for vertigo.  Patient had episode like this in the past

## 2021-09-16 NOTE — Patient Instructions (Signed)
It was great to see you! Thank you for allowing me to participate in your care!  It looks like your symptoms are consistent with Vertigo, this is a problem with your inner ear, that can act up from time to time. There is nothing you need to do to prevent this, but there is medicine you can take, if it happens again.  Our plans for today:  - Vertigo  Repeat symptoms:  Try over the counter Bonine (meclazine), and see if you get relief Try Epley Maneuver (located in handout)  If no relief with Bonine, schedule appointment with clinic May need stronger medicine, or referral for rehab  Take care and seek immediate care sooner if you develop any concerns.   Dr. Bess Kinds, MD [facility]

## 2021-09-16 NOTE — Progress Notes (Unsigned)
  SUBJECTIVE:   CHIEF COMPLAINT / HPI:   Dizziness s/p initiating carvedilol, started for sinus tachycardia, noted in visit on 5/8 Hx: Wed night she was dizzy when she went to sleep, whenever she turned her head. Woke up in the am and the dizziness was still there. She had slight dizziness through out the day. Woke up this morning and had no dizziness. Stopped taking medicine last night, and hasn't taken it this morning. No more dizziness. Was taking pulse and BP yesterday and appreciates that HR was 70-90's, BP was 120-140's/70-80's. Was taking the medicine for 2 wks with no symptoms. Felt like she was spinning, it was stronger when she moved head, and better when se was still. Dizziness was better when she lied down. No vomiting, but felt like she could. No new foods, just more of eggs that morning. Has happened in the past with similar symptoms.   No sweating, fast heart rate, skipped beats sensation with dizziness with symptoms  PERTINENT  PMH / PSH: HTN, DM, Sinus Tachycardia  OBJECTIVE:  There were no vitals taken for this visit.  General: NAD, pleasant, able to participate in exam Cardiac: RRR, no murmurs auscultated. Respiratory: CTAB, normal effort, no wheezes, rales or rhonchi Abdomen: soft, non-tender, non-distended, normoactive bowel sounds Extremities: warm and well perfused, no edema or cyanosis. Skin: warm and dry, no rashes noted Neuro: alert, no FND, speech normal, strength and sensation grossly intact throughout, no facial asymetry Psych: Normal affect and mood  ASSESSMENT/PLAN:  No problem-specific Assessment & Plan notes found for this encounter.   No orders of the defined types were placed in this encounter.  No orders of the defined types were placed in this encounter.  No follow-ups on file. @SIGNNOTE @ {    This will disappear when note is signed, click to select method of visit    :1}

## 2021-09-17 ENCOUNTER — Other Ambulatory Visit: Payer: Self-pay | Admitting: Family Medicine

## 2021-09-17 DIAGNOSIS — M816 Localized osteoporosis [Lequesne]: Secondary | ICD-10-CM

## 2021-09-17 DIAGNOSIS — I10 Essential (primary) hypertension: Secondary | ICD-10-CM

## 2021-09-19 DIAGNOSIS — H8110 Benign paroxysmal vertigo, unspecified ear: Secondary | ICD-10-CM | POA: Insufficient documentation

## 2021-09-19 NOTE — Assessment & Plan Note (Signed)
Patient diagnosed with sinus tachycardia and started on beta-blocker at last visit.  Patient has been taking beta-blocker for 2 weeks without symptoms, and then began developing symptoms of vertigo.  Patient had stopped beta-blocker as potential source of symptoms.  Encourage patient to restart beta-blocker as vertigo symptoms likely from BPPV and self resolved. - Continue Coreg 3.125

## 2021-09-19 NOTE — Assessment & Plan Note (Addendum)
Patient reports symptoms of dizziness with turning head, feeling like the room was spinning, lasted nearly 24 hours.  With no complaints of hearing loss or tinnitus, less concerning for Mnire's disease.  Patient with no focal neurologic deficits, less concerning for stroke or TIA.  Patient with no complaints of tachycardia sweating flush symptoms thus vasovagal syncope and patient's BP and pulse were appropriate during episodes making hypotension unlikely.  No complaints on exam today, vertigo symptoms have resolved.  Educated patient on BPPV as well as over-the-counter meclizine and epilepsy maneuvers. -Meclizine OTC (Bonine) for next episode -Epley maneuvers for next episode -Follow up in clinic, if no improvement in vertigo symptoms with Bonine/Epley maneuvers

## 2021-09-20 ENCOUNTER — Encounter: Payer: Self-pay | Admitting: Family Medicine

## 2021-09-20 ENCOUNTER — Ambulatory Visit (INDEPENDENT_AMBULATORY_CARE_PROVIDER_SITE_OTHER): Payer: 59 | Admitting: Family Medicine

## 2021-09-20 VITALS — BP 122/68 | HR 83 | Ht 64.0 in | Wt 120.0 lb

## 2021-09-20 DIAGNOSIS — R000 Tachycardia, unspecified: Secondary | ICD-10-CM | POA: Diagnosis not present

## 2021-09-20 NOTE — Assessment & Plan Note (Signed)
Improved and asymptomatic on treatment. EKG with NSR and HR of 83.  - Continue carvedilol 3.125mg  BID - Pt to call or seek care if adverse effects

## 2021-09-20 NOTE — Progress Notes (Signed)
    SUBJECTIVE:   CHIEF COMPLAINT / HPI:   Sinus tachycardia Patient reports that she did have a little episode of dizziness and was seen on 5/26 and was diagnosed with BPPV at that time.  Patient has since been taking her carvedilol as prescribed and is doing well with it.  She does not have any symptoms of fluttering in her chest like she previously did.  Denies any presyncopal episodes.  PERTINENT  PMH / PSH: Reviewed  OBJECTIVE:   BP 122/68   Pulse 83   Ht 5\' 4"  (1.626 m)   Wt 120 lb (54.4 kg)   SpO2 96%   BMI 20.60 kg/m   Gen: well-appearing, NAD CV: RRR, no m/r/g appreciated, no peripheral edema Pulm: CTAB, no wheezes/crackles GI: soft, non-tender, non-distended     ASSESSMENT/PLAN:   Sinus tachycardia Improved and asymptomatic on treatment. EKG with NSR and HR of 83.  - Continue carvedilol 3.125mg  BID - Pt to call or seek care if adverse effects    Patient will be in from June to August, so we will defer further medication changes until she returns for follow-up. At next visit we will consider discussing the following: - Adding SGLT2 - Considering alendronate holiday  September, DO [facility]

## 2021-09-20 NOTE — Patient Instructions (Addendum)
Your EKG looks great, let's keep on the same medications. Let me know if you have any issues. Follow-up with me when you come back from your trip.

## 2021-09-22 ENCOUNTER — Other Ambulatory Visit: Payer: Self-pay

## 2021-09-22 DIAGNOSIS — E119 Type 2 diabetes mellitus without complications: Secondary | ICD-10-CM

## 2021-09-22 MED ORDER — ONETOUCH DELICA LANCETS 33G MISC
1.0000 | Freq: Every day | 12 refills | Status: DC
Start: 2021-09-22 — End: 2022-12-01

## 2021-09-22 MED ORDER — ONETOUCH VERIO VI STRP: ORAL_STRIP | 12 refills | Status: DC

## 2021-09-27 ENCOUNTER — Encounter: Payer: Self-pay | Admitting: *Deleted

## 2021-12-15 ENCOUNTER — Other Ambulatory Visit: Payer: Self-pay

## 2021-12-15 DIAGNOSIS — E119 Type 2 diabetes mellitus without complications: Secondary | ICD-10-CM

## 2021-12-16 MED ORDER — METFORMIN HCL 1000 MG PO TABS: 1000.0000 mg | ORAL_TABLET | Freq: Two times a day (BID) | ORAL | 2 refills | Status: DC

## 2021-12-18 ENCOUNTER — Other Ambulatory Visit: Payer: Self-pay | Admitting: Family Medicine

## 2021-12-18 DIAGNOSIS — I10 Essential (primary) hypertension: Secondary | ICD-10-CM

## 2021-12-24 ENCOUNTER — Other Ambulatory Visit: Payer: Self-pay | Admitting: Family Medicine

## 2021-12-24 DIAGNOSIS — I10 Essential (primary) hypertension: Secondary | ICD-10-CM

## 2022-02-20 ENCOUNTER — Ambulatory Visit
Admission: RE | Admit: 2022-02-20 | Discharge: 2022-02-20 | Disposition: A | Discharge status: Home / Self Care | Payer: Medicare Other | Source: Ambulatory Visit | Attending: Family Medicine | Admitting: Family Medicine

## 2022-02-20 DIAGNOSIS — M810 Age-related osteoporosis without current pathological fracture: Secondary | ICD-10-CM

## 2022-02-20 DIAGNOSIS — Z780 Asymptomatic menopausal state: Secondary | ICD-10-CM | POA: Diagnosis not present

## 2022-02-20 DIAGNOSIS — M8589 Other specified disorders of bone density and structure, multiple sites: Secondary | ICD-10-CM | POA: Diagnosis not present

## 2022-03-14 ENCOUNTER — Encounter: Payer: 59 | Admitting: Family Medicine

## 2022-03-19 ENCOUNTER — Other Ambulatory Visit: Payer: Self-pay | Admitting: Family Medicine

## 2022-03-19 DIAGNOSIS — I10 Essential (primary) hypertension: Secondary | ICD-10-CM

## 2022-04-13 ENCOUNTER — Telehealth: Payer: Self-pay | Admitting: Family Medicine

## 2022-04-13 NOTE — Telephone Encounter (Signed)
Left message for patient to call back and schedule Medicare Annual Wellness Visit (AWV) either virtually or phone Left  my Kimberly House number [phone]   awvi 05/26/19 per [place]  please schedule with Nurse Health Adviser   45 min for awv-i and in office appointments 30 min for awv-s  phone/virtual appointments

## 2022-05-04 ENCOUNTER — Ambulatory Visit (INDEPENDENT_AMBULATORY_CARE_PROVIDER_SITE_OTHER): Payer: 59 | Admitting: Family Medicine

## 2022-05-04 ENCOUNTER — Encounter: Payer: Self-pay | Admitting: Family Medicine

## 2022-05-04 VITALS — BP 139/74 | HR 71 | Ht 64.0 in | Wt 117.8 lb

## 2022-05-04 DIAGNOSIS — Z0001 Encounter for general adult medical examination with abnormal findings: Secondary | ICD-10-CM

## 2022-05-04 DIAGNOSIS — I10 Essential (primary) hypertension: Secondary | ICD-10-CM

## 2022-05-04 DIAGNOSIS — M810 Age-related osteoporosis without current pathological fracture: Secondary | ICD-10-CM

## 2022-05-04 DIAGNOSIS — E119 Type 2 diabetes mellitus without complications: Secondary | ICD-10-CM

## 2022-05-04 LAB — POCT GLYCOSYLATED HEMOGLOBIN (HGB A1C): HbA1c, POC (controlled diabetic range): 8.8 % — AB (ref 0.0–7.0)

## 2022-05-04 MED ORDER — EMPAGLIFLOZIN 10 MG PO TABS: 10.0000 mg | ORAL_TABLET | Freq: Every day | ORAL | 3 refills | Status: DC

## 2022-05-04 NOTE — Assessment & Plan Note (Signed)
A1c trending upwards to 8.8 (goal of <8). Feel that elevation to an SGLT2 is appropriate at this time. Discussed risks and benefits as well as potential side effects with patient.  - Metformin 1000mg  BID - Jardiance 10mg  daily - BMP in 1 month

## 2022-05-04 NOTE — Assessment & Plan Note (Signed)
BP mildly elevated at 139/74.  - Continue amlodipine 5mg  daily and coreg 3.125 BID - Adding Jardiance today  - Follow-up in 1 month

## 2022-05-04 NOTE — Progress Notes (Signed)
    SUBJECTIVE:   Chief compliant/HPI: annual examination  Kimberly House is a 77 y.o. who presents today for an annual exam with no acute concerns.   Review of systems form notable for right leg pain x2 weeks that has improved with taking Tumeric.  Updated history tabs and problem list.   OBJECTIVE:   BP 139/74   Pulse 71   Ht 5\' 4"  (1.626 m)   Wt 117 lb 12.8 oz (53.4 kg)   SpO2 98%   BMI 20.22 kg/m   Gen: well-appearing, NAD CV: RRR, no m/r/g appreciated, no peripheral edema Pulm: CTAB, no wheezes/crackles GI: soft, non-tender, non-distended  ASSESSMENT/PLAN:   Diabetes mellitus (HCC) A1c trending upwards to 8.8 (goal of <8). Feel that elevation to an SGLT2 is appropriate at this time. Discussed risks and benefits as well as potential side effects with patient.  - Metformin 1000mg  BID - Jardiance 10mg  daily - BMP in 1 month  Essential hypertension BP mildly elevated at 139/74.  - Continue amlodipine 5mg  daily and coreg 3.125 BID - Adding Jardiance today  - Follow-up in 1 month  Osteoporosis Dexa done last year is not improved from prior. Discussed consideration of Prolia and patient would prefer to continue taking the Alendronate at this time.     Annual Examination  See AVS for age appropriate recommendations.   PHQ score 0, reviewed and discussed. Blood pressure reviewed and at goal.  Asked about intimate partner violence and patient reports none.   Considered the following items based upon USPSTF recommendations: Lipid panel (nonfasting or fasting) discussed based upon AHA recommendations and not ordered.  Consider repeat every 4-6 years.  Reviewed risk factors for latent tuberculosis and not indicated  Discussed family history, BRCA testing not indicated. Tool used to risk stratify was Pedigree Assessment tool    Follow up in 1 month or sooner if indicated.    [name], [place]

## 2022-05-04 NOTE — Patient Instructions (Addendum)
Your A1c today was 8.8, which is higher than it was at the last visit.  Our goal is to keep you <8.  I want you to keep taking your metformin but I am also going to add on a medication called Jardiance.  This medication can make you pee a little bit more.  It also has a risk for causing yeast infections so please just let me know if you start having any type of vaginal discharge.  I want you to come back in a month so that we can check your labs to make sure your kidneys are tolerating it.

## 2022-05-04 NOTE — Assessment & Plan Note (Signed)
Dexa done last year is not improved from prior. Discussed consideration of Prolia and patient would prefer to continue taking the Alendronate at this time.

## 2022-06-02 ENCOUNTER — Encounter: Payer: Self-pay | Admitting: Family Medicine

## 2022-06-02 ENCOUNTER — Ambulatory Visit (INDEPENDENT_AMBULATORY_CARE_PROVIDER_SITE_OTHER): Payer: 59 | Admitting: Family Medicine

## 2022-06-02 VITALS — BP 100/70 | HR 90 | Temp 98.1°F | Wt 115.4 lb

## 2022-06-02 DIAGNOSIS — Z23 Encounter for immunization: Secondary | ICD-10-CM | POA: Diagnosis not present

## 2022-06-02 DIAGNOSIS — E119 Type 2 diabetes mellitus without complications: Secondary | ICD-10-CM

## 2022-06-02 DIAGNOSIS — I10 Essential (primary) hypertension: Secondary | ICD-10-CM | POA: Diagnosis not present

## 2022-06-02 NOTE — Assessment & Plan Note (Signed)
BP well controlled today - Continue amlodipine 31m daily and coreg 3.1262mBID

## 2022-06-02 NOTE — Assessment & Plan Note (Signed)
Tolerating Jardiance well, no concerns for dehydrations. Has noted improvement in CBGs with fasting checks <140.  - Continue Jardiance  - BMP today  - Foot Exam done today - 3-4 month follow-up for diabetes check before going to the [place]

## 2022-06-02 NOTE — Patient Instructions (Signed)
Everything looks good today, we are going to get your labs today to check your kidneys.  Let's follow-up in June before you go to the [place], I will be graduating at the end of June so we will make sure to get your medications in for your trip.

## 2022-06-02 NOTE — Progress Notes (Signed)
    SUBJECTIVE:   CHIEF COMPLAINT / HPI:   Diabetes - Recently started on Jardiance, is following up for labs - Has slightly increased urinary frequency - No concerns for yeast infection  - Checks her CBGs in the morning and they have all been <140 without  -Would like to follow-up with me in June before she goes on her extended trip to the [place]  Flu shot - would like it today  PERTINENT  PMH / PSH: Reviewed  OBJECTIVE:   BP 100/70   Pulse 90   Temp 98.1 F (36.7 C)   Wt 115 lb 6.4 oz (52.3 kg)   SpO2 97%   BMI 19.81 kg/m   General: NAD, well-appearing, well-nourished Respiratory: No respiratory distress, breathing comfortably, able to speak in full sentences Skin: warm and dry, no rashes noted on exposed skin Psych: Appropriate affect and mood  Diabetic Foot Exam - Simple   Simple Foot Form Diabetic Foot exam was performed with the following findings: Yes 06/02/2022  8:36 AM  Visual Inspection No deformities, no ulcerations, no other skin breakdown bilaterally: Yes Sensation Testing Intact to touch and monofilament testing bilaterally: Yes Pulse Check Posterior Tibialis and Dorsalis pulse intact bilaterally: Yes Comments     ASSESSMENT/PLAN:   Diabetes mellitus ([place]) Tolerating Jardiance well, no concerns for dehydrations. Has noted improvement in CBGs with fasting checks <140.  - Continue Jardiance  - BMP today  - Foot Exam done today - 3-4 month follow-up for diabetes check before going to the [place]   Essential hypertension BP well controlled today - Continue amlodipine 5mg  daily and coreg 3.125mg  BID   Healthcare Maintenance - Flu shot today - Will need Medicare Annual Wellness visit  [name], [place]

## 2022-06-03 LAB — BASIC METABOLIC PANEL
BUN/Creatinine Ratio: 25 (ref 12–28)
BUN: 19 mg/dL (ref 8–27)
CO2: 21 mmol/L (ref 20–29)
Calcium: 10 mg/dL (ref 8.7–10.3)
Chloride: 100 mmol/L (ref 96–106)
Creatinine, Ser: 0.77 mg/dL (ref 0.57–1.00)
Glucose: 180 mg/dL — ABNORMAL HIGH (ref 70–99)
Potassium: 4.6 mmol/L (ref 3.5–5.2)
Sodium: 141 mmol/L (ref 134–144)
eGFR: 80 mL/min/{1.73_m2} (ref >59)

## 2022-06-16 ENCOUNTER — Other Ambulatory Visit: Payer: Self-pay | Admitting: Family Medicine

## 2022-06-16 DIAGNOSIS — I10 Essential (primary) hypertension: Secondary | ICD-10-CM

## 2022-08-18 ENCOUNTER — Other Ambulatory Visit: Payer: Self-pay | Admitting: Family Medicine

## 2022-08-25 DIAGNOSIS — H2513 Age-related nuclear cataract, bilateral: Secondary | ICD-10-CM | POA: Diagnosis not present

## 2022-08-25 DIAGNOSIS — H25013 Cortical age-related cataract, bilateral: Secondary | ICD-10-CM | POA: Diagnosis not present

## 2022-08-25 DIAGNOSIS — H52203 Unspecified astigmatism, bilateral: Secondary | ICD-10-CM | POA: Diagnosis not present

## 2022-08-25 DIAGNOSIS — E119 Type 2 diabetes mellitus without complications: Secondary | ICD-10-CM | POA: Diagnosis not present

## 2022-08-25 DIAGNOSIS — H5213 Myopia, bilateral: Secondary | ICD-10-CM | POA: Diagnosis not present

## 2022-08-25 DIAGNOSIS — H524 Presbyopia: Secondary | ICD-10-CM | POA: Diagnosis not present

## 2022-08-25 DIAGNOSIS — H353132 Nonexudative age-related macular degeneration, bilateral, intermediate dry stage: Secondary | ICD-10-CM | POA: Diagnosis not present

## 2022-08-27 ENCOUNTER — Other Ambulatory Visit: Payer: Self-pay | Admitting: Family Medicine

## 2022-08-27 DIAGNOSIS — E119 Type 2 diabetes mellitus without complications: Secondary | ICD-10-CM

## 2022-08-27 DIAGNOSIS — E785 Hyperlipidemia, unspecified: Secondary | ICD-10-CM

## 2022-08-29 ENCOUNTER — Other Ambulatory Visit: Payer: Self-pay

## 2022-08-29 DIAGNOSIS — E119 Type 2 diabetes mellitus without complications: Secondary | ICD-10-CM

## 2022-08-29 DIAGNOSIS — E785 Hyperlipidemia, unspecified: Secondary | ICD-10-CM

## 2022-08-29 LAB — HM DIABETES EYE EXAM

## 2022-08-29 MED ORDER — ATORVASTATIN CALCIUM 40 MG PO TABS: 40.0000 mg | ORAL_TABLET | Freq: Every day | ORAL | 0 refills | Status: DC

## 2022-09-11 ENCOUNTER — Other Ambulatory Visit: Payer: Self-pay | Admitting: Family Medicine

## 2022-09-11 DIAGNOSIS — I10 Essential (primary) hypertension: Secondary | ICD-10-CM

## 2022-09-24 ENCOUNTER — Other Ambulatory Visit: Payer: Self-pay | Admitting: Family Medicine

## 2022-09-24 DIAGNOSIS — E119 Type 2 diabetes mellitus without complications: Secondary | ICD-10-CM

## 2022-09-28 ENCOUNTER — Ambulatory Visit (INDEPENDENT_AMBULATORY_CARE_PROVIDER_SITE_OTHER): Payer: 59 | Admitting: Family Medicine

## 2022-09-28 ENCOUNTER — Encounter: Payer: Self-pay | Admitting: Family Medicine

## 2022-09-28 VITALS — BP 140/59 | HR 84 | Ht 64.0 in | Wt 113.2 lb

## 2022-09-28 DIAGNOSIS — E119 Type 2 diabetes mellitus without complications: Secondary | ICD-10-CM

## 2022-09-28 DIAGNOSIS — I10 Essential (primary) hypertension: Secondary | ICD-10-CM | POA: Diagnosis not present

## 2022-09-28 DIAGNOSIS — M810 Age-related osteoporosis without current pathological fracture: Secondary | ICD-10-CM

## 2022-09-28 LAB — POCT GLYCOSYLATED HEMOGLOBIN (HGB A1C): HbA1c, POC (controlled diabetic range): 7.8 % — AB (ref 0.0–7.0)

## 2022-09-28 MED ORDER — CARVEDILOL 3.125 MG PO TABS: 3.1250 mg | ORAL_TABLET | Freq: Two times a day (BID) | ORAL | 3 refills | Status: DC

## 2022-09-28 MED ORDER — EMPAGLIFLOZIN 10 MG PO TABS: 10.0000 mg | ORAL_TABLET | Freq: Every day | ORAL | 3 refills | Status: DC

## 2022-09-28 MED ORDER — AMLODIPINE BESYLATE 10 MG PO TABS: 10.0000 mg | ORAL_TABLET | Freq: Every day | ORAL | 3 refills | Status: DC

## 2022-09-28 NOTE — Assessment & Plan Note (Signed)
BP elevated today, home log shows typical measurements around 140s/70s. Patient has had issues with hyperkalemia even on very low doses of lisinopril previously. Would prefer patient take an ARB for kidney protection, but have cautions about starting those since she is going to the [place] and we would not be able to have quickly follow-up for lab monitoring.  - Increase amlodipine to 10mg  daily - Continue coreg 3.125mg  BID - Continue to check BP while overseas

## 2022-09-28 NOTE — Assessment & Plan Note (Signed)
Currently on Alendronate holiday. - Alendronate holiday for 2 years unless pathologic fracture.  - DEXA scan completed last year, would repeat when patient is thinking to restart in 2026

## 2022-09-28 NOTE — Patient Instructions (Signed)
I am increasing your blood pressure medication (amlodipine) to 10mg  daily, the prescription has been sent to the pharmacy.  Your A1c today was 7.8, which is good for you right now as I don't want you to have low sugars. Make sure to watch your blood sugars while you are overseas and to watch how much carbs (rice and breads) we are eating while there.   I sent the prescriptions for your Jardiance and Carvedilol for the 90 day supplies.

## 2022-09-28 NOTE — Progress Notes (Signed)
    SUBJECTIVE:   CHIEF COMPLAINT / HPI:   T2DM - Checking BG at home: yes - Medications: metformin 1000mg  BID, jardiance 10mg  daily, atorvastatin 40mg  daily - Compliance: good - foot exam: 06/02/2022 - microalbumin: last done 2023 was 434.2   - denies symptoms of hypoglycemia, polyuria, polydipsia, numbness extremities, foot ulcers/trauma  HTN: - Medications: amlodipine 5mg , coreg 3.125mg  BID - Compliance: good - Checking BP at home: yes, average is in the 140s/70s - Denies any SOB, CP, vision changes, LE edema, medication SEs, or symptoms of hypotension  Traveling - Will be back in August for her cataract surgery - Going to the [place] to visit family    PERTINENT  PMH / PSH: Reviewed  OBJECTIVE:   BP (!) 140/59   Pulse 84   Ht 5\' 4"  (1.626 m)   Wt 113 lb 3.2 oz (51.3 kg)   SpO2 97%   BMI 19.43 kg/m   Gen: well-appearing, NAD CV: RRR, no m/r/g appreciated, no peripheral edema Pulm: CTAB, no wheezes/crackles  ASSESSMENT/PLAN:   Diabetes mellitus (HCC) A1c improved from prior at 7.8, patient somewhat frail and would have significant concerns for hypoglycemia. Checks fasting sugars, which average around 130, which I am okay with at this time.  - Continue Jardiance 10mg  daily and metformin 1000mg  BID - Urine Microalbumin today - 3 Month follow-up for A1c  Essential hypertension BP elevated today, home log shows typical measurements around 140s/70s. Patient has had issues with hyperkalemia even on very low doses of lisinopril previously. Would prefer patient take an ARB for kidney protection, but have cautions about starting those since she is going to the [place] and we would not be able to have quickly follow-up for lab monitoring.  - Increase amlodipine to 10mg  daily - Continue coreg 3.125mg  BID - Continue to check BP while overseas  Osteoporosis Currently on Alendronate holiday. - Alendronate holiday for 2 years unless pathologic fracture.  - DEXA  scan completed last year, would repeat when patient is thinking to restart in 2026     Evelena Leyden, DO [facility]

## 2022-09-28 NOTE — Assessment & Plan Note (Signed)
A1c improved from prior at 7.8, patient somewhat frail and would have significant concerns for hypoglycemia. Checks fasting sugars, which average around 130, which I am okay with at this time.  - Continue Jardiance 10mg  daily and metformin 1000mg  BID - Urine Microalbumin today - 3 Month follow-up for A1c

## 2022-10-01 LAB — MICROALBUMIN / CREATININE URINE RATIO
Creatinine, Urine: 32.2 mg/dL
Microalb/Creat Ratio: 119 mg/g creat — ABNORMAL HIGH (ref 0–29)
Microalbumin, Urine: 38.4 ug/mL

## 2022-12-01 ENCOUNTER — Other Ambulatory Visit: Payer: Self-pay

## 2022-12-01 DIAGNOSIS — E119 Type 2 diabetes mellitus without complications: Secondary | ICD-10-CM

## 2022-12-01 MED ORDER — ONETOUCH DELICA LANCETS 33G MISC: 1.0000 | Freq: Every day | 12 refills | Status: DC

## 2022-12-01 MED ORDER — ONETOUCH VERIO VI STRP: ORAL_STRIP | 12 refills | Status: DC

## 2022-12-14 DIAGNOSIS — H25812 Combined forms of age-related cataract, left eye: Secondary | ICD-10-CM | POA: Diagnosis not present

## 2022-12-14 DIAGNOSIS — H25012 Cortical age-related cataract, left eye: Secondary | ICD-10-CM | POA: Diagnosis not present

## 2022-12-14 DIAGNOSIS — H2512 Age-related nuclear cataract, left eye: Secondary | ICD-10-CM | POA: Diagnosis not present

## 2023-01-04 DIAGNOSIS — H2511 Age-related nuclear cataract, right eye: Secondary | ICD-10-CM | POA: Diagnosis not present

## 2023-01-04 DIAGNOSIS — H25011 Cortical age-related cataract, right eye: Secondary | ICD-10-CM | POA: Diagnosis not present

## 2023-01-04 DIAGNOSIS — H25811 Combined forms of age-related cataract, right eye: Secondary | ICD-10-CM | POA: Diagnosis not present

## 2023-01-11 ENCOUNTER — Encounter: Payer: Self-pay | Admitting: Student

## 2023-01-11 ENCOUNTER — Ambulatory Visit (INDEPENDENT_AMBULATORY_CARE_PROVIDER_SITE_OTHER): Payer: 59 | Admitting: Student

## 2023-01-11 VITALS — BP 138/80 | HR 78 | Temp 98.0°F | Ht 60.0 in | Wt 117.2 lb

## 2023-01-11 DIAGNOSIS — E119 Type 2 diabetes mellitus without complications: Secondary | ICD-10-CM | POA: Diagnosis not present

## 2023-01-11 LAB — POCT GLYCOSYLATED HEMOGLOBIN (HGB A1C): HbA1c, POC (controlled diabetic range): 8.4 % — AB (ref 0.0–7.0)

## 2023-01-11 NOTE — Progress Notes (Signed)
    SUBJECTIVE:   CHIEF COMPLAINT / HPI:   Diabetes, Type 2 - Last A1c -7.8 from 3 months ago - Medications: Metformin 1000 mg twice daily, Jardiance 10 mg - Compliance: Good compliance - Checking BG at home: 120s - Diet: poor - Exercise: inactive - Eye exam: completed, recently completed bilateral cataract surgery. - Microalbumin: Up-to-date, slightly elevated but improving. - Statin: Lipitor 40 mg daily - Denies symptoms of hypoglycemia, polyuria, polydipsia, numbness extremities, foot ulcers/trauma   PERTINENT  PMH / PSH: Reviewed  OBJECTIVE:   BP 138/80   Pulse 78   Temp 98 F (36.7 C)   Ht 5' (1.524 m)   Wt 117 lb 3.2 oz (53.2 kg)   SpO2 98%   BMI 22.89 kg/m    Physical Exam General: Alert, well appearing, NAD Cardiovascular: RRR, No Murmurs, Normal S2/S2 Respiratory: CTAB, No wheezing or Rales Abdomen: No distension or tenderness Extremities: No edema on extremities    ASSESSMENT/PLAN:   Diabetes mellitus (HCC) A1C today slightly elevated from 4 months ago at 8.4%.  Patient reports compliance and good tolerance to medication but has been inconsistent with diet and no exercising.  No symptoms for hypoglycemia or hypoglycemia.  Recently started on readings for renal protection. -Obtain A1c -Continue metformin and Jardiance -Encourage exercising and advised patient on diet modifications -To follow-up in 3 months to recheck A1c and at this time we will decide need for meds adjustment pending A1c.    Healthcare maintenance Patient is due for the flu and shingles vaccine.  She declined getting the vaccine today with plans to get it after a discharge events.  Patient has preference to get it at her pharmacy.   Jerre Simon, MD [facility]

## 2023-01-11 NOTE — Assessment & Plan Note (Signed)
A1C today slightly elevated from 4 months ago at 8.4%.  Patient reports compliance and good tolerance to medication but has been inconsistent with diet and no exercising.  No symptoms for hypoglycemia or hypoglycemia.  Recently started on readings for renal protection. -Obtain A1c -Continue metformin and Jardiance -Encourage exercising and advised patient on diet modifications -To follow-up in 3 months to recheck A1c and at this time we will decide need for meds adjustment pending A1c.

## 2023-01-11 NOTE — Patient Instructions (Signed)
It was wonderful to see you today. Thank you for allowing me to be a part of your care. Below is a short summary of what we discussed at your visit today:  A1c today was elevated from 4 months ago at 8.4%  I recommend watching your diet cut out high carbs diet like white rice, pasta, bread.  You  can eat more vegetables and fruits.  Also recommend exercising as this could help improve your blood sugars overall.  For now we will continue you on your metformin and Jardiance as prescribed.  Follow-up in 3 months lets recheck your A1c and if continued elevated will look to make adjustments.  It is flu season and you can get your flu vaccine and Shingles vaccine at any pharmacy.   If you have any questions or concerns, please do not hesitate to contact us via phone or MyChart message.   Jerre Simon, MD [facility]

## 2023-02-09 DIAGNOSIS — H353132 Nonexudative age-related macular degeneration, bilateral, intermediate dry stage: Secondary | ICD-10-CM | POA: Diagnosis not present

## 2023-02-12 ENCOUNTER — Telehealth: Payer: Self-pay | Admitting: Pharmacist

## 2023-02-12 NOTE — Telephone Encounter (Signed)
Patient contacted to gauge interest for potential inclusion in Liberate study. Most recent A1c > 8. Shared information on Liberate study and patient expressed willingness for a visit and discuss further. Appointment was scheduled for next Tuesday (02/20/2023)  Total time with patient call and documentation of interaction: 7 minutes.

## 2023-02-20 ENCOUNTER — Encounter: Payer: Self-pay | Admitting: Pharmacist

## 2023-02-20 ENCOUNTER — Ambulatory Visit: Payer: 59 | Admitting: Student

## 2023-02-20 ENCOUNTER — Ambulatory Visit: Payer: 59 | Admitting: Pharmacist

## 2023-02-20 VITALS — BP 137/84 | HR 95 | Ht 61.0 in | Wt 113.4 lb

## 2023-02-20 DIAGNOSIS — E119 Type 2 diabetes mellitus without complications: Secondary | ICD-10-CM

## 2023-02-20 DIAGNOSIS — H8112 Benign paroxysmal vertigo, left ear: Secondary | ICD-10-CM | POA: Diagnosis not present

## 2023-02-20 DIAGNOSIS — Z7984 Long term (current) use of oral hypoglycemic drugs: Secondary | ICD-10-CM

## 2023-02-20 LAB — POCT GLYCOSYLATED HEMOGLOBIN (HGB A1C): HbA1c, POC (controlled diabetic range): 7.9 % — AB (ref 0.0–7.0)

## 2023-02-20 MED ORDER — EMPAGLIFLOZIN 25 MG PO TABS
25.0000 mg | ORAL_TABLET | Freq: Every day | ORAL | 3 refills | Status: DC
Start: 2023-02-20 — End: 2023-12-17

## 2023-02-20 NOTE — Patient Instructions (Addendum)
It was nice to see you today!  Your goal blood sugar is 80-130 before eating and less than 180 after eating.  Medication Changes:  Increase Jardiance (empagliflozin) from 10 mg once daily to 20 mg (2 tablets) once daily. Once finishing supply of Jardiance 10 mg, START Jardiance 25 mg once daily  Monitor blood sugars at home and keep a log (glucometer or piece of paper) to bring with you to your next visit.  Keep up the good work with diet and exercise. Aim for a diet full of vegetables, fruit and lean meats (chicken, [place], fish). Try to limit salt intake by eating fresh or frozen vegetables (instead of canned), rinse canned vegetables prior to cooking and do not add any additional salt to meals.

## 2023-02-20 NOTE — Assessment & Plan Note (Signed)
-  Patient provided verbal consent to participate in LIBERATE study, however, POC A1c was 7.9% excluding her from enrollment. Diabetes longstanding currently uncontrolled, but improved from A1c of 8.4% on 01/11/2023. Patient is able to verbalize appropriate hypoglycemia management plan. Medication adherence appears good. Control is suboptimal due to suboptimal medication regimen. - Increased empagliflozin (Jardiance) from 10 mg once daily to 20 mg once daily, with eventual increase to 25 mg once daily. Patient has 10 mg on hand, increased to 20 mg (2 tablets) once daily, once she runs out increase to 25 mg once daily. Patient counseled on proper sick day rules - Continued metformin 1000 mg BID -Patient educated on purpose, proper use, and potential adverse effects.  -Extensively discussed pathophysiology of diabetes, recommended lifestyle interventions, dietary effects on blood sugar control.  -Counseled on s/sx of and management of hypoglycemia.

## 2023-02-20 NOTE — Addendum Note (Signed)
Addended by: Kathrin Ruddy on: 02/20/2023 04:28 PM   Modules accepted: Level of Service

## 2023-02-20 NOTE — Assessment & Plan Note (Addendum)
Horizontal nystagmus with left head tilt and otherwise normal neurological exam and negative orthostatics highly suggestive of BPPV.  Recommend vestibular rehab and further provided information for Epley maneuver.  Return in 1 month, should this not improve recommend intracranial imaging.  Recommend returning sooner or ED visit if symptoms significantly worsen or she begins to have falls.

## 2023-02-20 NOTE — Patient Instructions (Addendum)
It was great to see you today! Thank you for choosing [facility] for your primary care.  Today we addressed: I suspect you are experiencing vertigo.  I have attached a handout to describe this.  I have referred you to vestibular rehab as it is the most proven thing to help with fixing this.  You may also try the Epley maneuver at home.  Should this persist even after completing vestibular rehab, I would recommend returning for discussion about doing some head imaging.  If you haven't already, sign up for My Chart to have easy access to your labs results, and communication with your primary care physician.  Return in about 1 month (around 03/23/2023) for Dizziness follow-up. Please arrive 15 minutes before your appointment to ensure smooth check in process.  We appreciate your efforts in making this happen.  Thank you for allowing me to participate in your care, Shelby Mattocks, DO 02/20/2023, 11:36 AM PGY-3, [facility]

## 2023-02-20 NOTE — Progress Notes (Signed)
  SUBJECTIVE:   CHIEF COMPLAINT / HPI:   Patient presents with her family members with concern of 2 to 3 days of dizziness while standing up and attempting to walk.  She notes that she has had dizziness/unsteadiness strictly when she gets up from a seated position and walks around.  She needs a support person sometimes when she walks as she feels that she has poor balance.  Denies room spinning sensation.  She does not feel a dizziness sensation when she is laying in bed or turning from side-to-side.  Denies fever or infectious symptomatology, chest pain, shortness of breath, bowel or bladder changes.  She denies history of vertigo although this is present in her chart.  Denies vision changes, she just recently had cataract surgery.  Her family checked her sugar which lowest it was was 115 during these episodes.  Additionally her blood pressure ranges from 130s-160s/70s-80s.  PERTINENT  PMH / PSH: HTN, HLD, T2DM, osteoporosis, BPPV  OBJECTIVE:  BP: 137/84 HR 95 SpO2 98% General: Well-appearing, NAD CV: RRR, no murmurs auscultated Pulm: CTAB, normal WOB Neuro: CN II through XII intact, normal finger-to-nose and heel-to-shin testing, steady and careful gait requiring balance assistance, horizontal nystagmus with left head tilt  ASSESSMENT/PLAN:   Assessment & Plan Benign paroxysmal positional vertigo of left ear Horizontal nystagmus with left head tilt and otherwise normal neurological exam and negative orthostatics highly suggestive of BPPV.  Recommend vestibular rehab and further provided information for Epley maneuver.  Return in 1 month, should this not improve recommend intracranial imaging.  Recommend returning sooner or ED visit if symptoms significantly worsen or she begins to have falls.   Return in about 1 month (around 03/23/2023) for Dizziness follow-up. Shelby Mattocks, DO 02/20/2023, 3:38 PM PGY-3, [facility]

## 2023-02-20 NOTE — Progress Notes (Signed)
S:     Chief Complaint  Patient presents with   Medication Management    Diabetes HTN - Dizziness   77 y.o. female who presents for hypertension evaluation, education, and management.   PMH is significant for T2DM, HTN, HLD.  Patient was referred and last seen by Primary Care Provider, Dr. Elliot Gurney, on 01/11/2023.   At last visit, A1c was elevated at 8.4%, continued metformin and empagliflozin (Jardiance).   Today, patient arrives in good spirits and presents with grandson, Raford Pitcher, and daughter, Dennison Nancy, for assistance. Reports dizziness with standing, sitting up, walking, requires assistance ambulating so she doesn't fall. Dizziness started Sunday (10/27) in the evening, accompanied with gradual BP increase. Patient took an amlodipine. Following that recorded a home BP of 160/84 mmHg and daughter provided her with losartan 10 mg yesterday evening to try to help with BP. ~1hr after losartan BP was 146/83 mmHg. Reporting dizziness has not let up since it started, but intensity has changed, with most intense dizziness in the evenings. Denies headache, blurred vision, swelling. Patient presented today for potential LIBERATE enrollment.   Reporting in clinic dizziness is the same intensity sitting while standing, but it is worse while walking.   Patient reports Hypertension and Diabetes was diagnosed in 2013.   Family/Social history: Mother had HTN  Medication adherence good. Patient has taken BP medications today.   Current diabetes medications include: empagliflozin (Jardiance) 10 mg once daily, metformin 1000 mg twice daily Current hyperlipidemia medications include: atorvastatin 40 mg once daily  Current antihypertensives include: amlodipine 10 mg once daily, carvedilol 3.125 mg twice daily  Antihypertensives tried in the past include: None  Do you feel that your medications are working for you? yes Have you been experiencing any side effects to the medications prescribed?  no Insurance coverage: [employer] & [place] Medicaid  Patient denies hypoglycemic events.  Patient reports nocturia (nighttime urination). ~2x nightly Patient denies neuropathy (nerve pain). Patient denies visual changes. Patient denies self foot exams.   Reported home Blood Sugar readings: lowest 115 mg/dL; highest ~629B mg/dL Reported home BP readings:  10/28  7:30 BG 135 mg/dL 28:41 324/40 mmHg  1:02 160/82 mmHg  7:00 146/83 mmHg (~1 hr after losartan 10 mg) 10/29  7:50 159/89 mmHg  Patient reported dietary habits: Eats 3 meals/day - eating more vegetables, fried protein, a little rice/bread, apples (pink lady) Drinks: water, coffee with sugar free creamer   Patient-reported exercise habits: used to walk, wants to start on treadmill  ASCVD risk factors include: T2DM, HTN, HLD  O:  Review of Systems  Neurological:  Positive for dizziness.  All other systems reviewed and are negative.   Physical Exam Vitals reviewed.  Constitutional:      Appearance: Normal appearance.  Pulmonary:     Effort: Pulmonary effort is normal.  Neurological:     Mental Status: She is alert.  Psychiatric:        Mood and Affect: Mood normal.        Behavior: Behavior normal.        Thought Content: Thought content normal.        Judgment: Judgment normal.    Last 3 Office BP readings: BP Readings from Last 3 Encounters:  02/20/23 137/84  01/11/23 138/80  09/28/22 (!) 140/59   BMET    Component Value Date/Time   NA 141 06/02/2022 1158   K 4.6 06/02/2022 1158   CL 100 06/02/2022 1158   CO2 21 06/02/2022 1158   GLUCOSE  180 (H) 06/02/2022 1158   GLUCOSE 113 (H) 02/02/2016 1004   BUN 19 06/02/2022 1158   CREATININE 0.77 06/02/2022 1158   CREATININE 0.94 (H) 02/02/2016 1004   CALCIUM 10.0 06/02/2022 1158   GFRNONAA 73 02/27/2020 0935   GFRNONAA 62 02/02/2016 1004   GFRAA 84 02/27/2020 0935   GFRAA 71 02/02/2016 1004   Lab Results  Component Value Date   HGBA1C  8.4 (A) 01/11/2023    POC A1c Today: 7.9%   Vitals:   02/20/23 1028  BP: 137/84  Pulse: 95  SpO2: 98%     Lipid Panel     Component Value Date/Time   CHOL 126 08/27/2020 0934   TRIG 67 08/27/2020 0934   HDL 60 08/27/2020 0934   CHOLHDL 2.1 08/27/2020 0934   CHOLHDL 2.0 02/02/2016 1004   VLDL 14 02/02/2016 1004   LDLCALC 52 08/27/2020 0934   Renal function: CrCl cannot be calculated (Patient's most recent lab result is older than the maximum 21 days allowed.).  Clinical ASCVD: No  The ASCVD Risk score (Arnett DK, et al., 2019) failed to calculate for the following reasons:   The valid total cholesterol range is 130 to 320 mg/dL  Patient is participating in a Managed Medicaid Plan:  Yes   A/P: -Patient provided verbal consent to participate in LIBERATE study, however, POC A1c was 7.9% excluding her from enrollment. Diabetes longstanding currently uncontrolled, but improved from A1c of 8.4% on 01/11/2023. Patient is able to verbalize appropriate hypoglycemia management plan. Medication adherence appears good. Control is suboptimal due to suboptimal medication regimen. - Increased empagliflozin (Jardiance) from 10 mg once daily to 20 mg once daily, with eventual increase to 25 mg once daily. Patient has 10 mg on hand, increased to 20 mg (2 tablets) once daily, once she runs out increase to 25 mg once daily. Patient counseled on proper sick day rules - Continued metformin 1000 mg BID -Patient educated on purpose, proper use, and potential adverse effects.  -Extensively discussed pathophysiology of diabetes, recommended lifestyle interventions, dietary effects on blood sugar control.  -Counseled on s/sx of and management of hypoglycemia.   ASCVD risk - primary prevention in patient with diabetes. Last LDL is 52 at goal of <70 mg/dL.  -Continued atorvastatin 40 mg once daily  Hypertension longstanding currently uncontrolled. Blood pressure goal of <130/80 mmHg. Medication  adherence good. Blood pressure control is suboptimal due to episodes of dizziness, requiring assistance with ambulation, and home BP readings ranging from 137/78 to 160/82 mmHg. In clinic pressure of 137/84 sitting, which increased to 155/87 with standing. Dr. Royal Piedra met with patient to assess symptoms.  -Continued amlodipine 10 mg once daily, carvedilol 3.125 mg twice daily -Patient educated on purpose, proper use, and potential adverse effects.  -Counseled on lifestyle modifications for blood pressure control including reduced dietary sodium, increased exercise, adequate sleep.  Results reviewed and written information provided.    Written patient instructions provided. Patient verbalized understanding of treatment plan.  Total time in face to face counseling 49 minutes.    Follow-up:  Pharmacist PRN - no additional follow-up planned. PCP clinic visit in ~1 month.  Patient seen with Shona Simpson, PharmD Candidate.

## 2023-02-21 NOTE — Progress Notes (Signed)
Reviewed and agree with Dr Koval's plan.   

## 2023-02-23 ENCOUNTER — Encounter: Payer: Self-pay | Admitting: Student

## 2023-02-28 ENCOUNTER — Other Ambulatory Visit: Payer: Self-pay

## 2023-02-28 DIAGNOSIS — E785 Hyperlipidemia, unspecified: Secondary | ICD-10-CM

## 2023-02-28 DIAGNOSIS — E119 Type 2 diabetes mellitus without complications: Secondary | ICD-10-CM

## 2023-02-28 MED ORDER — ATORVASTATIN CALCIUM 40 MG PO TABS: 40.0000 mg | ORAL_TABLET | Freq: Every day | ORAL | 0 refills | Status: DC

## 2023-03-21 ENCOUNTER — Ambulatory Visit (INDEPENDENT_AMBULATORY_CARE_PROVIDER_SITE_OTHER): Payer: 59 | Admitting: Student

## 2023-03-21 ENCOUNTER — Encounter: Payer: Self-pay | Admitting: Student

## 2023-03-21 VITALS — BP 104/80 | HR 94 | Wt 112.4 lb

## 2023-03-21 DIAGNOSIS — H8112 Benign paroxysmal vertigo, left ear: Secondary | ICD-10-CM | POA: Diagnosis not present

## 2023-03-21 DIAGNOSIS — R634 Abnormal weight loss: Secondary | ICD-10-CM | POA: Diagnosis not present

## 2023-03-21 NOTE — Assessment & Plan Note (Signed)
Weight today is 112lb 6.4 oz. she has had about 5 pound weight loss since the beginning of the year (11 months) no red flag symptoms at this time including night sweats, hematochezia or irregular bowel habits.  Has denied any abdominal pain and abdominal exam generally unremarkable.  Suspect weight loss is most likely dietary in the setting of patient mostly eating vegetables and fluid with minimal intake of protein and carbs.  Patient expressed decreased have intake due to a history of diabetes.  Unfortunately patient has not had any colonoscopy in the past given that she has continued to decline colonoscopy. -Continue close monitoring of weight -Encourage incorporation of protein and carbs into her diet -Could consider Cologuard testing and should she continue to have weight loss

## 2023-03-21 NOTE — Patient Instructions (Addendum)
Nice to meet you.  Glad to hear that your vertigo has improved and you are relieved at this time.  Your weight is most likely dietary because you are mostly eating tables.  I recommend incorporating more protein and carbohydrates to your meal to help stabilize your weight.

## 2023-03-21 NOTE — Assessment & Plan Note (Signed)
Symptoms resolved with home Epley maneuver.  Neurological exam intact today. -Return precaution discussed with patient who verbalized understanding.

## 2023-03-21 NOTE — Progress Notes (Signed)
    SUBJECTIVE:   CHIEF COMPLAINT / HPI: BPPV   77 year old female with history of vertigo Presenting today for follow-up Seen a month ago for BPPV Attempted in office Epley Maneuver  Today patient reports of Epley maneuver relieved symptoms Currently asymptomatic and denies any headaches or nausea.   Weight loss Patient expressed concerns about recent weight loss.  She reports she has been in the 120s earlier in the year however most recent weight is mostly 112-113.  She endorses normal bowel movement, no abdominal pain, no nausea or vomiting.  She has not had any blood in stool.  Of note patient reports due to her diabetes she has cut out mostly carbs and eating mostly vegetables.  Occasionally have proteins with egg and chicken.  Overall most of her meals are mostly vegetables and fruits.  No recent colonoscopy as patient has continued to decline these over the years.  PERTINENT  PMH / PSH: Reviewed   OBJECTIVE:   BP 104/80   Pulse 94   Wt 112 lb 6.4 oz (51 kg)   SpO2 96%   BMI 21.24 kg/m    Physical Exam General: Alert, well appearing, NAD Cardiovascular: RRR, No Murmurs, Normal S2/S2 Respiratory: CTAB, No wheezing or Rales Abdomen: No distension or tenderness Extremities: No edema on extremities   Neuro: No focal neurological deficits.  ASSESSMENT/PLAN:   BPPV (benign paroxysmal positional vertigo) Symptoms resolved with home Epley maneuver.  Neurological exam intact today. -Return precaution discussed with patient who verbalized understanding.  Weight loss Weight today is 112lb 6.4 oz. she has had about 5 pound weight loss since the beginning of the year (11 months) no red flag symptoms at this time including night sweats, hematochezia or irregular bowel habits.  Has denied any abdominal pain and abdominal exam generally unremarkable.  Suspect weight loss is most likely dietary in the setting of patient mostly eating vegetables and fluid with minimal intake of protein  and carbs.  Patient expressed decreased have intake due to a history of diabetes.  Unfortunately patient has not had any colonoscopy in the past given that she has continued to decline colonoscopy. -Continue close monitoring of weight -Encourage incorporation of protein and carbs into her diet -Could consider Cologuard testing and should she continue to have weight loss     Jerre Simon, MD [facility]

## 2023-04-03 ENCOUNTER — Other Ambulatory Visit: Payer: Self-pay

## 2023-04-03 DIAGNOSIS — E119 Type 2 diabetes mellitus without complications: Secondary | ICD-10-CM

## 2023-04-04 MED ORDER — METFORMIN HCL 1000 MG PO TABS: 1000.0000 mg | ORAL_TABLET | Freq: Two times a day (BID) | ORAL | 0 refills | Status: DC

## 2023-05-21 ENCOUNTER — Other Ambulatory Visit: Payer: Self-pay

## 2023-05-21 DIAGNOSIS — E119 Type 2 diabetes mellitus without complications: Secondary | ICD-10-CM

## 2023-05-21 MED ORDER — METFORMIN HCL 1000 MG PO TABS: 1000.0000 mg | ORAL_TABLET | Freq: Two times a day (BID) | ORAL | 3 refills | Status: AC

## 2023-05-25 ENCOUNTER — Other Ambulatory Visit: Payer: Self-pay | Admitting: Student

## 2023-05-25 DIAGNOSIS — E785 Hyperlipidemia, unspecified: Secondary | ICD-10-CM

## 2023-05-25 DIAGNOSIS — E119 Type 2 diabetes mellitus without complications: Secondary | ICD-10-CM

## 2023-08-21 ENCOUNTER — Other Ambulatory Visit: Payer: Self-pay | Admitting: Student

## 2023-08-21 DIAGNOSIS — E119 Type 2 diabetes mellitus without complications: Secondary | ICD-10-CM

## 2023-08-21 DIAGNOSIS — E785 Hyperlipidemia, unspecified: Secondary | ICD-10-CM

## 2023-08-28 ENCOUNTER — Ambulatory Visit (INDEPENDENT_AMBULATORY_CARE_PROVIDER_SITE_OTHER): Admitting: Student

## 2023-08-28 ENCOUNTER — Encounter: Payer: Self-pay | Admitting: Student

## 2023-08-28 VITALS — BP 118/72 | HR 85 | Ht 61.0 in | Wt 113.8 lb

## 2023-08-28 DIAGNOSIS — E119 Type 2 diabetes mellitus without complications: Secondary | ICD-10-CM

## 2023-08-28 LAB — POCT GLYCOSYLATED HEMOGLOBIN (HGB A1C): HbA1c, POC (controlled diabetic range): 7.8 % — AB (ref 0.0–7.0)

## 2023-08-28 NOTE — Assessment & Plan Note (Signed)
 Well-controlled diabetes A1c of 7.8% today.  Good tolerance on current medication. Annual Foot exam completed today was normal.  - Obtain A1c, microalbuminuria/creatinine ratio - Encouraged to continue eating and exercise - Annual Foot exam completed today - Follow up in 3-4 months, at next visit will completed annual well visit with labs including BMP

## 2023-08-28 NOTE — Patient Instructions (Addendum)
 Pleasure to see you today.  Your A1c today is stable at 7.8% which is within goal for you.  Please make sure to continue working on diet and try to stay active.  We collected your urine sample today to test for your kidney function.  You can get the Shingrix vaccine at the pharmacy. You will need a 2nd shot 2-6 months after the first. This vaccine is important to help prevent shingles.

## 2023-08-28 NOTE — Progress Notes (Signed)
    SUBJECTIVE:   CHIEF COMPLAINT / HPI:   78 year old female history of type 2 diabetes presenting today for diabetes follow-up.  Currently on Jardiance  25 mg daily and metformin  1000 mg twice daily.  Also on atorvastatin  40 mg daily.  Denies any hyperglycemic episodes or hypoglycemia episodes.  Up-to-date on annual eye exam and recently at fall of last year he had cataract surgery on both eyes.  Last A1c from 4 months ago was 7.9%.   PERTINENT  PMH / PSH: Reviewed   OBJECTIVE:   BP 118/72   Pulse 85   Ht 5\' 1"  (1.549 m)   Wt 113 lb 12.8 oz (51.6 kg)   SpO2 98%   BMI 21.50 kg/m    Physical Exam General: Alert, well appearing, Cardiovascular: RRR, No Murmurs, Normal S2/S2 Respiratory: CTAB, No wheezing or Rales Abdomen: No distension or tenderness Foot exam: +2 pedal pulses bilaterally, notable lesions and sensations intact.  ASSESSMENT/PLAN:   Diabetes mellitus (HCC) Well-controlled diabetes A1c of 7.8% today.  Good tolerance on current medication. Annual Foot exam completed today was normal.  - Obtain A1c, microalbuminuria/creatinine ratio - Encouraged to continue eating and exercise - Annual Foot exam completed today - Follow up in 3-4 months, at next visit will completed annual well visit with labs including BMP     Goble Last, MD [facility]

## 2023-08-29 LAB — MICROALBUMIN / CREATININE URINE RATIO
Creatinine, Urine: 29.6 mg/dL
Microalb/Creat Ratio: 746 mg/g{creat} — ABNORMAL HIGH (ref 0–29)
Microalbumin, Urine: 220.8 ug/mL

## 2023-09-12 ENCOUNTER — Other Ambulatory Visit: Payer: Self-pay

## 2023-09-12 MED ORDER — AMLODIPINE BESYLATE 10 MG PO TABS: 10.0000 mg | ORAL_TABLET | Freq: Every day | ORAL | 3 refills | Status: AC

## 2023-10-01 ENCOUNTER — Other Ambulatory Visit: Payer: Self-pay

## 2023-10-01 MED ORDER — CARVEDILOL 3.125 MG PO TABS: 3.1250 mg | ORAL_TABLET | Freq: Two times a day (BID) | ORAL | 3 refills | Status: AC

## 2023-11-16 ENCOUNTER — Other Ambulatory Visit: Payer: Self-pay | Admitting: Student

## 2023-11-16 DIAGNOSIS — E785 Hyperlipidemia, unspecified: Secondary | ICD-10-CM

## 2023-11-16 DIAGNOSIS — E119 Type 2 diabetes mellitus without complications: Secondary | ICD-10-CM

## 2023-11-23 ENCOUNTER — Telehealth: Payer: Self-pay

## 2023-11-23 DIAGNOSIS — E119 Type 2 diabetes mellitus without complications: Secondary | ICD-10-CM

## 2023-11-23 NOTE — Telephone Encounter (Signed)
 Insurance will be changing what they will pay for with test strips and meter. After August 3rd she will need a new Rx. Please call pt to discuss options and which one you will be sending to [facility].  I think one of the devices that will be covered is Accu-chek.  Please call pt at [phone] to discuss.  Margit Dimes, CMA

## 2023-11-26 MED ORDER — ACCU-CHEK GUIDE W/DEVICE KIT: PACK | 0 refills | Status: AC

## 2023-11-26 MED ORDER — ACCU-CHEK GUIDE TEST VI STRP: ORAL_STRIP | 12 refills | Status: DC

## 2023-11-26 MED ORDER — ACCU-CHEK SOFTCLIX LANCETS MISC
12 refills | Status: AC
Start: 2023-11-26 — End: ?

## 2023-11-26 NOTE — Addendum Note (Signed)
 Addended by: Jesyca Weisenburger C on: 11/26/2023 03:06 PM   Modules accepted: Orders

## 2023-11-26 NOTE — Addendum Note (Signed)
 Addended by: MANON JESTER on: 11/26/2023 10:24 PM   Modules accepted: Orders

## 2023-11-26 NOTE — Telephone Encounter (Signed)
 Patient returns call to nurse line regarding this concern.   Patient is requesting that new rx be sent to [facility].   Pended prescriptions to encounter.   Will forward to new PCP, Dr. Manon, for review and sign off.   Chiquita JAYSON English, RN

## 2023-12-17 ENCOUNTER — Ambulatory Visit (INDEPENDENT_AMBULATORY_CARE_PROVIDER_SITE_OTHER): Admitting: Student

## 2023-12-17 VITALS — BP 110/79 | HR 89 | Wt 116.6 lb

## 2023-12-17 DIAGNOSIS — E119 Type 2 diabetes mellitus without complications: Secondary | ICD-10-CM

## 2023-12-17 LAB — POCT GLYCOSYLATED HEMOGLOBIN (HGB A1C): HbA1c, POC (controlled diabetic range): 7.5 % — AB (ref 0.0–7.0)

## 2023-12-17 MED ORDER — EMPAGLIFLOZIN 10 MG PO TABS: 10.0000 mg | ORAL_TABLET | Freq: Every day | ORAL | 3 refills | Status: AC

## 2023-12-17 NOTE — Progress Notes (Signed)
    SUBJECTIVE:   CHIEF COMPLAINT / HPI:   Kimberly House is a 78 year old female with type 2 diabetes mellitus who presents for a diabetes follow-up and A1c check.  Her A1c has decreased from 7.8% to 7.5%. Blood glucose levels are sometimes above 150 mg/dL but not consistently. She takes metformin  1000 mg daily and has adjusted her Jardiance  dose from 25 mg to approximately 12.5 mg daily since December 13, 2023, due to side effects.  She experienced nausea and a sensation of fullness, particularly around December 10, 2023, coinciding with her son's birthday. These symptoms began after increasing the Jardiance  dose from 10 mg to 25 mg in November 2024. She has since reduced the dose after discussing her symptoms with her family.   PERTINENT  PMH / PSH: Reviewed  OBJECTIVE:   BP 110/79   Pulse 89   Wt 116 lb 9.6 oz (52.9 kg)   SpO2 97%   BMI 22.03 kg/m    Physical Exam General: Alert, well appearing, NAD Cardiovascular: RRR, No Murmurs, Normal S2/S2 Respiratory: CTAB, No wheezing or Rales Abdomen: No distension or tenderness Extremities: No edema on extremities   Skin: Warm and dry  ASSESSMENT/PLAN:   Diabetes mellitus (HCC) Type 2 diabetes mellitus with improved glycemic control. Hemoglobin A1c decreased from 7.8% to 7.5%. Nausea and acid reflux symptoms resolved after reducing Jardiance  dosage. - Continue metformin  1000 mg daily. - Adjust Jardiance  to 10 mg daily. - Order Hgb A1c, Microalbumin/creatnine - Follow up hemoglobin A1c every six months.     Norleen April, MD [facility]

## 2023-12-17 NOTE — Patient Instructions (Addendum)
 Pleasure to see you today.  Your A1c today improved to 7.5%.  Please continue to take your metformin  as prescribed.  I have adjusted your Jardiance  to 10 mg daily.  Medication has been sent to your pharmacy.  Today we also collected urine sample to test for your kidney function.

## 2023-12-17 NOTE — Assessment & Plan Note (Signed)
 Type 2 diabetes mellitus with improved glycemic control. Hemoglobin A1c decreased from 7.8% to 7.5%. Nausea and acid reflux symptoms resolved after reducing Jardiance  dosage. - Continue metformin  1000 mg daily. - Adjust Jardiance  to 10 mg daily. - Order Hgb A1c, Microalbumin/creatnine - Follow up hemoglobin A1c every six months.

## 2023-12-18 LAB — MICROALBUMIN / CREATININE URINE RATIO
Creatinine, Urine: 41.9 mg/dL
Microalb/Creat Ratio: 442 mg/g{creat} — ABNORMAL HIGH (ref 0–29)
Microalbumin, Urine: 185.3 ug/mL

## 2023-12-19 ENCOUNTER — Ambulatory Visit: Payer: Self-pay | Admitting: Student

## 2024-02-12 ENCOUNTER — Other Ambulatory Visit: Payer: Self-pay | Admitting: Student

## 2024-02-12 DIAGNOSIS — E119 Type 2 diabetes mellitus without complications: Secondary | ICD-10-CM

## 2024-02-13 LAB — OPHTHALMOLOGY REPORT-SCANNED

## 2024-02-19 ENCOUNTER — Telehealth: Payer: Self-pay

## 2024-02-19 NOTE — Telephone Encounter (Signed)
 [facility] is requesting information about OneTouch Verio in [facility].  Placed faxed paper in PCP box for PCP to fax in missing information.  Harlene Reiter, CMA

## 2024-02-19 NOTE — Telephone Encounter (Signed)
 [facility] is requesting information about OneTouch Verio Virto Strip

## 2024-03-27 ENCOUNTER — Ambulatory Visit

## 2024-03-27 VITALS — Ht 61.0 in | Wt 115.6 lb

## 2024-03-27 DIAGNOSIS — Z0000 Encounter for general adult medical examination without abnormal findings: Secondary | ICD-10-CM | POA: Diagnosis not present

## 2024-03-27 NOTE — Patient Instructions (Signed)
 Kimberly House,  Thank you for taking the time for your Medicare Wellness Visit. I appreciate your continued commitment to your health goals. Please review the care plan we discussed, and feel free to reach out if I can assist you further.  Please note that Annual Wellness Visits do not include a physical exam. Some assessments may be limited, especially if the visit was conducted virtually. If needed, we may recommend an in-person follow-up with your provider.  Ongoing Care Seeing your primary care provider every 3 to 6 months helps us  monitor your health and provide consistent, personalized care.   Referrals If a referral was made during today's visit and you haven't received any updates within two weeks, please contact the referred provider directly to check on the status.  Recommended Screenings:  Health Maintenance  Topic Date Due   Medicare Annual Wellness Visit  Never done   Zoster (Shingles) Vaccine (1 of 2) 11/17/1995   Yearly kidney function blood test for diabetes  06/03/2023   Flu Shot  11/23/2023   COVID-19 Vaccine (4 - 2025-26 season) 12/24/2023   Hemoglobin A1C  06/18/2024   Complete foot exam   08/27/2024   Yearly kidney health urinalysis for diabetes  12/16/2024   DTaP/Tdap/Td vaccine (2 - Td or Tdap) 01/26/2025   Eye exam for diabetics  02/12/2025   Pneumococcal Vaccine for age over 21  Completed   Osteoporosis screening with Bone Density Scan  Completed   Hepatitis C Screening  Completed   Meningitis B Vaccine  Aged Out   Breast Cancer Screening  Discontinued       03/27/2024    8:34 AM  Advanced Directives  Does Patient Have a Medical Advance Directive? No  Would patient like information on creating a medical advance directive? No - Patient declined    Vision: Annual vision screenings are recommended for early detection of glaucoma, cataracts, and diabetic retinopathy. These exams can also reveal signs of chronic conditions such as diabetes and high blood  pressure.  Dental: Annual dental screenings help detect early signs of oral cancer, gum disease, and other conditions linked to overall health, including heart disease and diabetes.  Please see the attached documents for additional preventive care recommendations.

## 2024-03-27 NOTE — Progress Notes (Cosign Needed Addendum)
 I connected with  Reshonda Koerber on 03/27/24 by a audio enabled telemedicine application and verified that I am speaking with the correct person using two identifiers.  Patient Location: Home  Provider Location: Office/Clinic  Persons Participating in Visit: Patient.  I discussed the limitations of evaluation and management by telemedicine. The patient expressed understanding and agreed to proceed.   Vital Signs: Because this visit was a virtual/telehealth visit, some criteria may be missing or patient reported. Any vitals not documented were not able to be obtained and vitals that have been documented are patient reported.     Chief Complaint  Patient presents with   Medicare Wellness    INITIAL     Subjective:   Kimberly House is a 78 y.o. female who presents for a Medicare Annual Wellness Visit.  Visit info / Clinical Intake: Medicare Wellness Visit Type:: Initial Annual Wellness Visit Persons participating in visit and providing information:: patient Medicare Wellness Visit Mode:: Telephone If telephone:: video declined Since this visit was completed virtually, some vitals may be partially provided or unavailable. Missing vitals are due to the limitations of the virtual format.: Documented vitals are patient reported If Telephone or Video please confirm:: I connected with patient using audio/video enable telemedicine. I verified patient identity with two identifiers, discussed telehealth limitations, and patient agreed to proceed. Patient Location:: HOME Provider Location:: OFFICE Interpreter Needed?: No Pre-visit prep was completed: yes AWV questionnaire completed by patient prior to visit?: yes Date:: 03/27/24 Living arrangements:: (Patient-Rptd) with family/others Patient's Overall Health Status Rating: (Patient-Rptd) good Typical amount of pain: (Patient-Rptd) some Does pain affect daily life?: (Patient-Rptd) no Are you currently prescribed opioids?:  no  Dietary Habits and Nutritional Risks How many meals a day?: (Patient-Rptd) 3 Eats fruit and vegetables daily?: (Patient-Rptd) yes Most meals are obtained by: (Patient-Rptd) preparing own meals Diabetic:: (!) yes Any non-healing wounds?: no How often do you check your BS?: 1 Would you like to be referred to a Nutritionist or for Diabetic Management? : no  Functional Status Activities of Daily Living (to include ambulation/medication): (Patient-Rptd) Independent Ambulation: (Patient-Rptd) Independent Medication Administration: (Patient-Rptd) Independent Home Management (perform basic housework or laundry): (Patient-Rptd) Independent Manage your own finances?: (Patient-Rptd) yes Primary transportation is: (Patient-Rptd) family / friends Concerns about vision?: no *vision screening is required for WTM* Concerns about hearing?: no  Fall Screening Falls in the past year?: (Patient-Rptd) 0 Number of falls in past year: 0 Was there an injury with Fall?: 0 Fall Risk Category Calculator: 0 Patient Fall Risk Level: Low Fall Risk  Fall Risk Patient at Risk for Falls Due to: No Fall Risks Fall risk Follow up: Falls evaluation completed; Education provided  Home and Transportation Safety: All rugs have non-skid backing?: (Patient-Rptd) yes All stairs or steps have railings?: (Patient-Rptd) yes Grab bars in the bathtub or shower?: (Patient-Rptd) yes Have non-skid surface in bathtub or shower?: (Patient-Rptd) yes Good home lighting?: (Patient-Rptd) yes Regular seat belt use?: (Patient-Rptd) yes Hospital stays in the last year:: (Patient-Rptd) no  Cognitive Assessment Difficulty concentrating, remembering, or making decisions? : (Patient-Rptd) no Will 6CIT or Mini Cog be Completed: yes What year is it?: 0 points What month is it?: 0 points Give patient an address phrase to remember (5 components): [place] [address] About what time is it?: 0 points Count backwards from 20 to  1: 0 points Say the months of the year in reverse: 0 points Repeat the address phrase from earlier: 0 points 6 CIT Score:  0 points  Advance Directives (For Healthcare) Does Patient Have a Medical Advance Directive?: No Would patient like information on creating a medical advance directive?: No - Patient declined  Reviewed/Updated  Reviewed/Updated: Reviewed All (Medical, Surgical, Family, Medications, Allergies, Care Teams, Patient Goals)    Allergies (verified) Patient has no known allergies.   Current Medications (verified) Outpatient Encounter Medications as of 03/27/2024  Medication Sig   Accu-Chek Softclix Lancets lancets Please use to check blood sugar levels once daily. E11.9   amLODipine  (NORVASC ) 10 MG tablet Take 1 tablet (10 mg total) by mouth at bedtime.   atorvastatin  (LIPITOR) 40 MG tablet Take 1 tablet by mouth once daily   Blood Glucose Monitoring Suppl (ACCU-CHEK GUIDE) w/Device KIT Please use to check blood sugar levels once daily. E11.9   Calcium  Carbonate-Vitamin D3 (CALCIUM  600/VITAMIN D ) 600-400 MG-UNIT TABS Take 2 tablets by mouth daily with meal.   carvedilol  (COREG ) 3.125 MG tablet Take 1 tablet (3.125 mg total) by mouth 2 (two) times daily with a meal.   empagliflozin  (JARDIANCE ) 10 MG TABS tablet Take 1 tablet (10 mg total) by mouth daily.   metFORMIN  (GLUCOPHAGE ) 1000 MG tablet Take 1 tablet (1,000 mg total) by mouth 2 (two) times daily with a meal.   Multiple Vitamins-Minerals (PRESERVISION AREDS 2) CAPS Take 1 capsule by mouth 2 (two) times daily.   ONETOUCH VERIO test strip USE AS DIRECTED   No facility-administered encounter medications on file as of 03/27/2024.    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   Recieved medical treatment X 9 months per pt  report; no signs of active TB on CXR   Past Surgical History:  Procedure Laterality Date   EYE SURGERY     THYROIDECTOMY  04/24/1977   TOTAL ABDOMINAL HYSTERECTOMY     unknown if supracervical   Family History  Problem Relation Age of Onset   Thyroid disease Mother    Asthma Mother    Social History   Occupational History   Not on file  Tobacco Use   Smoking status: Never   Smokeless tobacco: Never  Substance and Sexual Activity   Alcohol use: No   Drug use: No   Sexual activity: Not on file   Tobacco Counseling Counseling given: Not Answered  SDOH Screenings   Food Insecurity: No Food Insecurity (03/27/2024)  Housing: Low Risk  (03/27/2024)  Transportation Needs: No Transportation Needs (03/27/2024)  Utilities: Not At Risk (03/27/2024)  Depression (PHQ2-9): Low Risk  (03/27/2024)  Physical Activity: Sufficiently Active (03/27/2024)  Social Connections: Moderately Integrated (03/27/2024)  Stress: No Stress Concern Present (03/27/2024)  Tobacco Use: Low Risk  (03/27/2024)  Health Literacy: Adequate Health Literacy (03/27/2024)   See flowsheets for full screening details  Depression Screen PHQ 2 & 9 Depression Scale- Over the past 2 weeks, how often have you been bothered by any of the following problems? Little interest or pleasure in doing things: 0 Feeling down, depressed, or hopeless (PHQ Adolescent also includes...irritable): 0 PHQ-2 Total Score: 0 Trouble falling or staying asleep, or sleeping too much: 0 Feeling tired or having little energy: 0 Poor appetite or overeating (PHQ Adolescent also includes...weight loss): 0 Feeling bad about yourself - or that you are a failure or have let yourself or your family down: 0 Trouble  concentrating on things, such as reading the newspaper or watching television [facility] Adolescent also includes...like school work): 0 Moving or speaking so slowly that other people could have noticed. Or the opposite - being so fidgety or restless  that you have been moving around a lot more than usual: 0 Thoughts that you would be better off dead, or of hurting yourself in some way: 0 PHQ-9 Total Score: 0 If you checked off any problems, how difficult have these problems made it for you to do your work, take care of things at home, or get along with other people?: Not difficult at all  Depression Treatment Depression Interventions/Treatment : EYV7-0 Score <4 Follow-up Not Indicated     Goals Addressed             This Visit's Progress    03/27/2024: My healthcare goal for 2026 is to increase my walking to 30 minutes 7 days a week.               Objective:    Today's Vitals   03/27/24 1037  Weight: 115 lb 9.6 oz (52.4 kg)  Height: 5' 1 (1.549 m)  PainSc: 0-No pain   Body mass index is 21.84 kg/m.  Hearing/Vision screen Hearing Screening - Comments:: Denies hearing difficulties.   Vision Screening - Comments:: Wears rx glasses - up to date with routine eye exams with  Dr. Ozell Bertin Immunizations and Health Maintenance Health Maintenance  Topic Date Due   Diabetic kidney evaluation - eGFR measurement  06/03/2023   COVID-19 Vaccine (5 - 2025-26 season) 12/24/2023   HEMOGLOBIN A1C  06/18/2024   FOOT EXAM  08/27/2024   Diabetic kidney evaluation - Urine ACR  12/16/2024   DTaP/Tdap/Td (2 - Td or Tdap) 01/26/2025   OPHTHALMOLOGY EXAM  02/12/2025   Medicare Annual Wellness (AWV)  03/27/2025   Pneumococcal Vaccine: 50+ Years  Completed   Influenza Vaccine  Completed   Bone Density Scan  Completed   Hepatitis C Screening  Completed   Zoster Vaccines- Shingrix  Completed   Meningococcal B Vaccine  Aged Out   Mammogram  Discontinued        Assessment/Plan:  This is a routine wellness examination for [place].  Patient Care Team: Manon Jester, DO as PCP - General (Family Medicine) Bertin Ozell, MD as Consulting Physician (Ophthalmology)  I have personally reviewed and noted the following in the  patient's chart:   Medical and social history Use of alcohol, tobacco or illicit drugs  Current medications and supplements including opioid prescriptions. Functional ability and status Nutritional status Physical activity Advanced directives List of other physicians Hospitalizations, surgeries, and ER visits in previous 12 months Vitals Screenings to include cognitive, depression, and falls Referrals and appointments  No orders of the defined types were placed in this encounter.  In addition, I have reviewed and discussed with patient certain preventive protocols, quality metrics, and best practice recommendations. A written personalized care plan for preventive services as well as general preventive health recommendations were provided to patient.   Roz LOISE Fuller, LPN   [id number]   Return in about 1 year (around 03/27/2025) for Medicare wellness.  After Visit Summary: (MyChart) Due to this being a telephonic visit, the after visit summary with patients personalized plan was offered to patient via MyChart   Nurse Notes: Patient aware of current care gaps.  Immunization record was verified by [facility] and updated in patient's chart.

## 2024-05-08 ENCOUNTER — Other Ambulatory Visit: Payer: Self-pay

## 2024-05-08 DIAGNOSIS — E785 Hyperlipidemia, unspecified: Secondary | ICD-10-CM

## 2024-05-08 DIAGNOSIS — E119 Type 2 diabetes mellitus without complications: Secondary | ICD-10-CM
